# Patient Record
Sex: Female | Born: 1955 | Race: White | Hispanic: No | Marital: Married | Smoking: Former smoker
Health system: Southern US, Community
[De-identification: ages and names within clinical notes are randomized; demographics above are authoritative.]

## PROBLEM LIST (undated history)

## (undated) DIAGNOSIS — D3A092 Benign carcinoid tumor of the stomach: Secondary | ICD-10-CM

## (undated) DIAGNOSIS — M1990 Unspecified osteoarthritis, unspecified site: Secondary | ICD-10-CM

## (undated) DIAGNOSIS — E034 Atrophy of thyroid (acquired): Secondary | ICD-10-CM

## (undated) DIAGNOSIS — I10 Essential (primary) hypertension: Secondary | ICD-10-CM

## (undated) DIAGNOSIS — G5600 Carpal tunnel syndrome, unspecified upper limb: Secondary | ICD-10-CM

## (undated) DIAGNOSIS — K921 Melena: Secondary | ICD-10-CM

## (undated) DIAGNOSIS — F329 Major depressive disorder, single episode, unspecified: Secondary | ICD-10-CM

## (undated) DIAGNOSIS — K760 Fatty (change of) liver, not elsewhere classified: Secondary | ICD-10-CM

## (undated) DIAGNOSIS — G4730 Sleep apnea, unspecified: Secondary | ICD-10-CM

## (undated) DIAGNOSIS — E539 Vitamin B deficiency, unspecified: Secondary | ICD-10-CM

## (undated) DIAGNOSIS — E782 Mixed hyperlipidemia: Secondary | ICD-10-CM

## (undated) HISTORY — DX: Sleep apnea, unspecified: G47.30

## (undated) HISTORY — DX: Melena: K92.1

## (undated) HISTORY — DX: Atrophy of thyroid (acquired): E03.4

## (undated) HISTORY — DX: Benign carcinoid tumor of the stomach: D3A.092

## (undated) HISTORY — PX: REPLACEMENT TOTAL KNEE: SUR1224

## (undated) HISTORY — DX: Mixed hyperlipidemia: E78.2

## (undated) HISTORY — DX: Essential (primary) hypertension: I10

## (undated) HISTORY — DX: Vitamin B deficiency, unspecified: E53.9

## (undated) HISTORY — DX: Major depressive disorder, single episode, unspecified: F32.9

## (undated) HISTORY — DX: Fatty (change of) liver, not elsewhere classified: K76.0

## (undated) HISTORY — PX: OTHER SURGICAL HISTORY: SHX169

## (undated) HISTORY — DX: Carpal tunnel syndrome, unspecified upper limb: G56.00

## (undated) HISTORY — DX: Unspecified osteoarthritis, unspecified site: M19.90

---

## 2014-02-14 ENCOUNTER — Other Ambulatory Visit: Payer: Self-pay | Admitting: Orthopedic Surgery

## 2014-02-14 DIAGNOSIS — Z01818 Encounter for other preprocedural examination: Secondary | ICD-10-CM

## 2014-02-14 DIAGNOSIS — M170 Bilateral primary osteoarthritis of knee: Secondary | ICD-10-CM | POA: Insufficient documentation

## 2014-02-14 HISTORY — DX: Bilateral primary osteoarthritis of knee: M17.0

## 2014-02-25 ENCOUNTER — Other Ambulatory Visit: Payer: Self-pay

## 2014-03-11 ENCOUNTER — Ambulatory Visit
Admission: RE | Admit: 2014-03-11 | Discharge: 2014-03-11 | Disposition: A | Discharge status: Home / Self Care | Payer: BC Managed Care – PPO | Source: Ambulatory Visit | Attending: Orthopedic Surgery | Admitting: Orthopedic Surgery

## 2014-03-11 DIAGNOSIS — Z01818 Encounter for other preprocedural examination: Secondary | ICD-10-CM

## 2014-07-25 DIAGNOSIS — Z96652 Presence of left artificial knee joint: Secondary | ICD-10-CM | POA: Insufficient documentation

## 2014-07-25 HISTORY — DX: Presence of left artificial knee joint: Z96.652

## 2016-01-29 DIAGNOSIS — Z96651 Presence of right artificial knee joint: Secondary | ICD-10-CM

## 2016-01-29 HISTORY — DX: Presence of right artificial knee joint: Z96.651

## 2016-02-06 IMAGING — MR MR KNEE*L* W/O CM
4 series · 29 of 40 positions shown · non-contrast
Comparison: None.

CLINICAL DATA: Consider protocol for pre-surgical planning

EXAM:
MRI OF THE LEFT KNEE WITHOUT CONTRAST
TECHNIQUE: Multiplanar, multisequence MR imaging of the knee was performed. No
intravenous contrast was administered.

[Series 3: knee true sag · sagittal · 1.0mm · 0.39mm/px · 14 of 111 slices shown (1 of 2)]
[im 1/111]
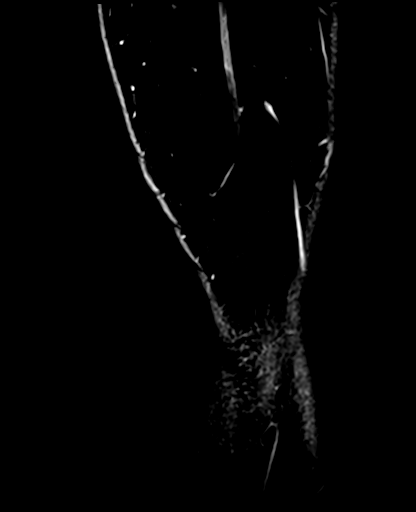
[im 5/111]
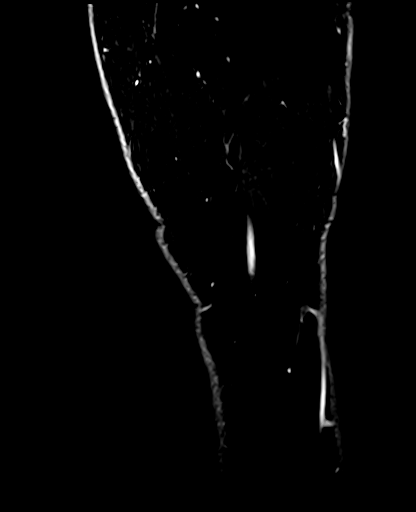
[im 10/111]
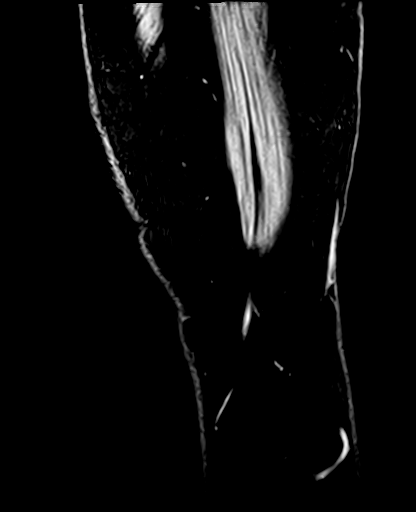
[im 14/111]
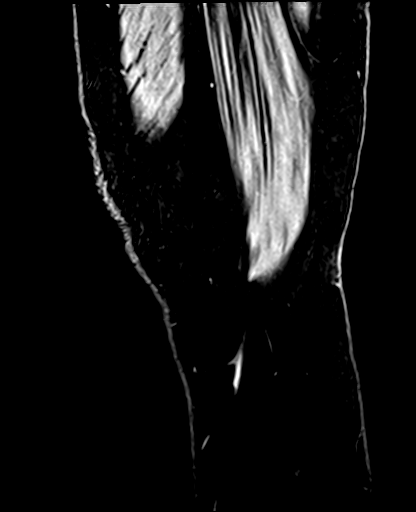
[im 19/111]
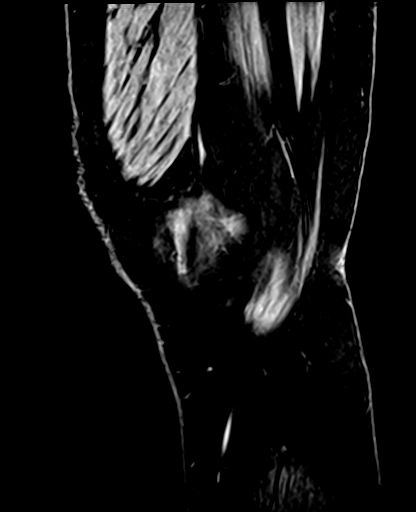
[im 23/111]
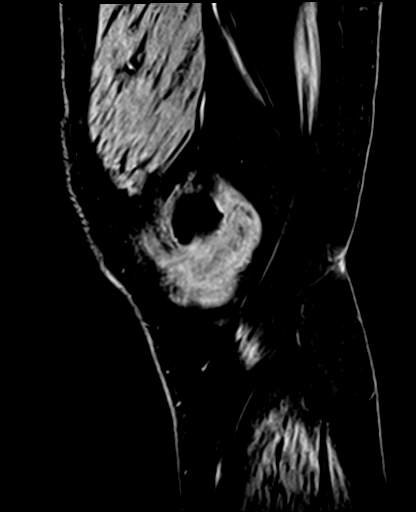
[im 28/111]
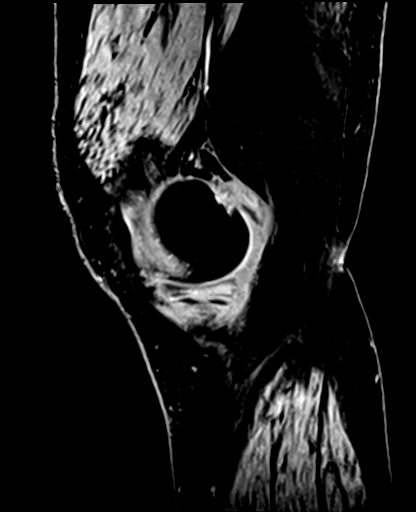
[im 33/111]
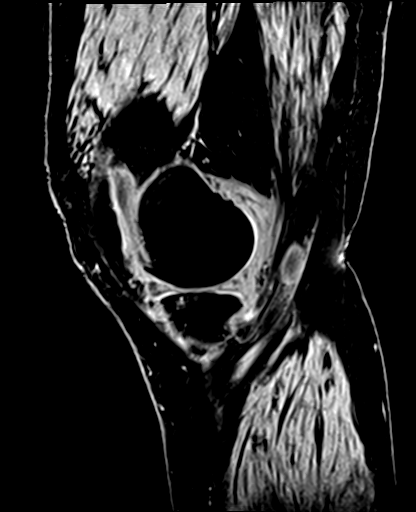
[im 46/111]
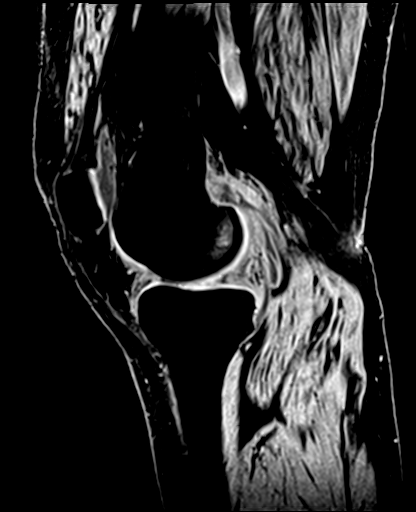
[im 56/111]
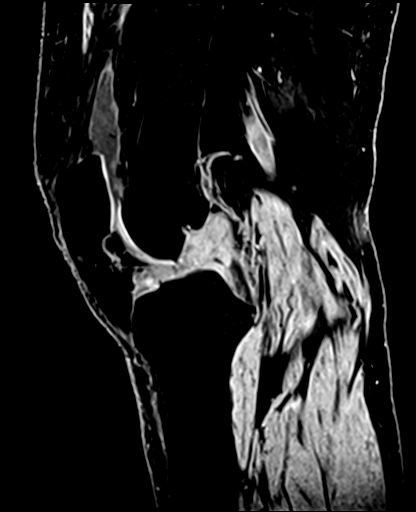
[im 65/111]
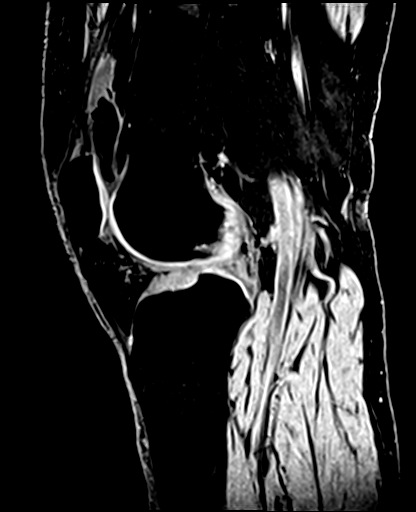
[im 78/111]
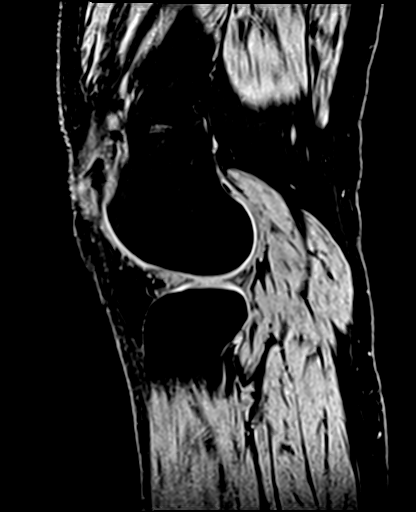
[im 92/111]
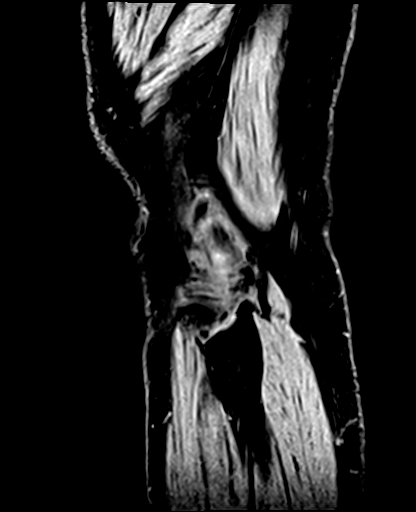
[im 106/111]
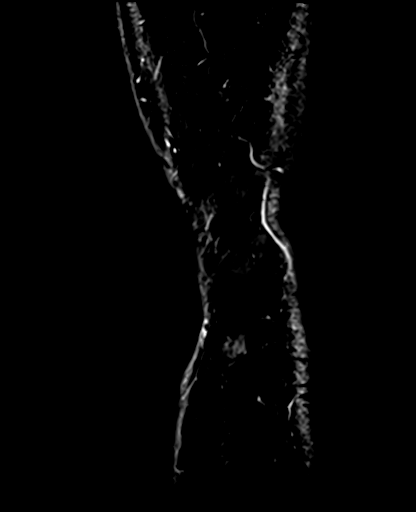

[Series 7: T1 · axial · 5.0mm · 1.02mm/px · z∈[-63,+70]mm · 4 of 20 slices shown (1 of 2)]
[im 1/20]
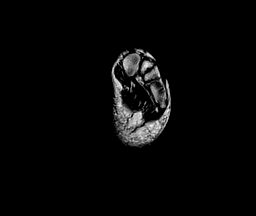
[im 7/20]
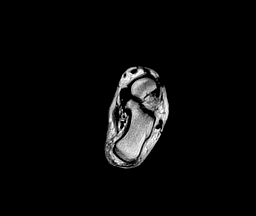
[im 13/20]
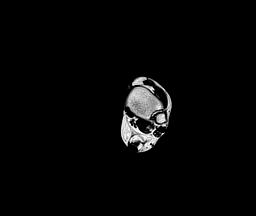
[im 20/20]
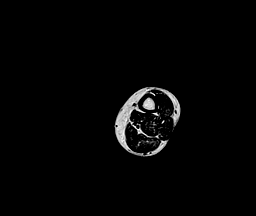

[Series 9: knee true sag · sagittal · 4.0mm · 0.51mm/px · 7 of 30 slices shown (2 of 2)]
[im 1/30]
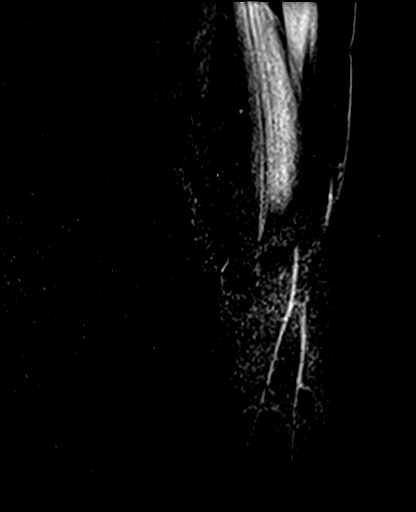
[im 5/30]
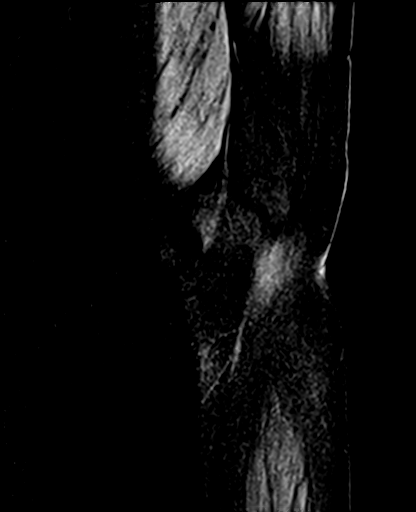
[im 10/30]
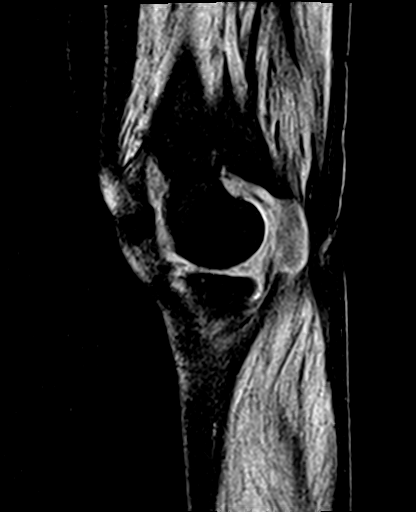
[im 15/30]
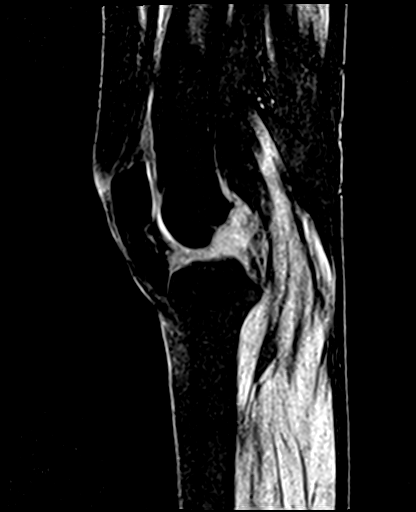
[im 20/30]
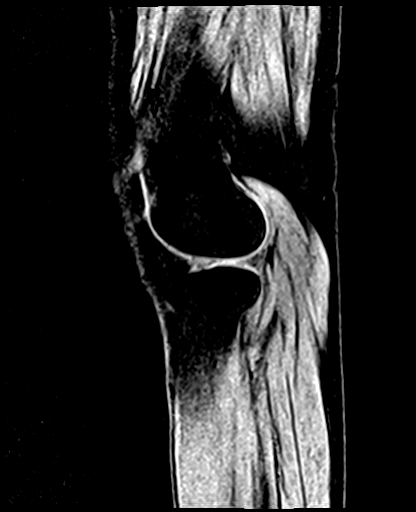
[im 25/30]
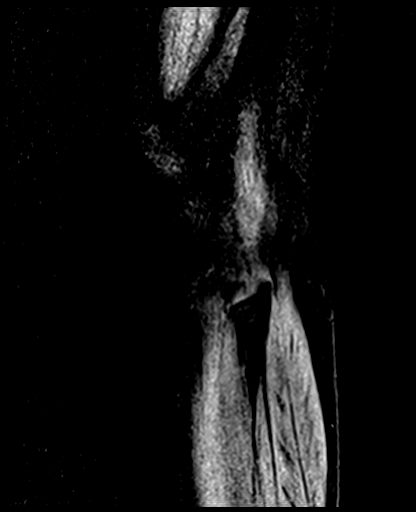
[im 30/30]
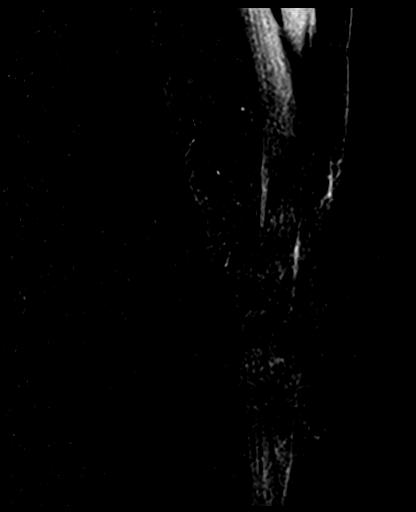

[Series 12: T1 · axial · 5.0mm · 1.41mm/px · z∈[+697,+830]mm · 4 of 20 slices shown (2 of 2)]
[im 1/20]
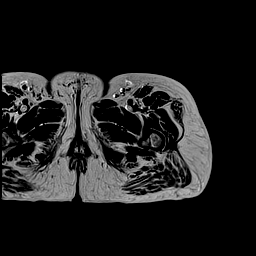
[im 7/20]
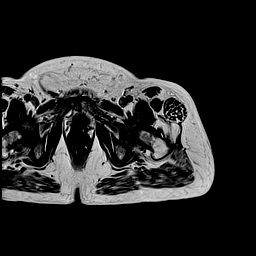
[im 13/20]
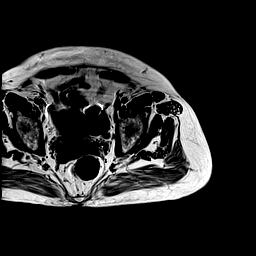
[im 20/20]
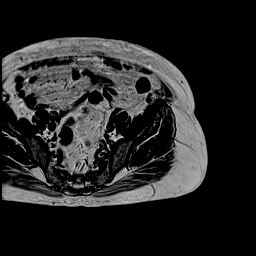

[29 of 40 positions shown; findings below may reference images not displayed]

FINDINGS: This examination is performed for preoperative planning prior to
left knee arthroplasty. The examination is not performed as a
diagnostic study of the left knee.

There is osteoarthritis of the medial femorotibial compartment with
subchondral reactive marrow changes and marginal osteophytosis.
There is medial femorotibial compartment cartilage loss. There is
expansion of the ACL with increased signal concerning for an ACL
cyst, but evaluation of the ACL is extremely limited on this
sequence. There is increased signal within the medial meniscus.

There is no focal marrow signal abnormality.

There is no hip fracture or dislocation. There is no avascular
necrosis.

There is no ankle fracture or dislocation. There is no malalignment.

The soft tissues are normal.
IMPRESSION: 1. MRI of the left knee performed for preoperative planning.
2. Medial left femorotibial compartment osteoarthritis.

## 2016-07-08 LAB — HM MAMMOGRAPHY

## 2016-07-12 DIAGNOSIS — D509 Iron deficiency anemia, unspecified: Secondary | ICD-10-CM | POA: Diagnosis not present

## 2016-07-12 DIAGNOSIS — D519 Vitamin B12 deficiency anemia, unspecified: Secondary | ICD-10-CM | POA: Diagnosis not present

## 2016-11-03 ENCOUNTER — Ambulatory Visit: Payer: BLUE CROSS/BLUE SHIELD | Admitting: Podiatry

## 2016-11-16 DIAGNOSIS — D649 Anemia, unspecified: Secondary | ICD-10-CM | POA: Diagnosis not present

## 2016-12-17 ENCOUNTER — Encounter: Payer: Self-pay | Admitting: Sports Medicine

## 2016-12-17 ENCOUNTER — Ambulatory Visit (INDEPENDENT_AMBULATORY_CARE_PROVIDER_SITE_OTHER): Payer: BLUE CROSS/BLUE SHIELD | Admitting: Sports Medicine

## 2016-12-17 VITALS — BP 123/74 | HR 78

## 2016-12-17 DIAGNOSIS — M79671 Pain in right foot: Secondary | ICD-10-CM

## 2016-12-17 DIAGNOSIS — M7751 Other enthesopathy of right foot: Secondary | ICD-10-CM | POA: Diagnosis not present

## 2016-12-17 DIAGNOSIS — M7661 Achilles tendinitis, right leg: Secondary | ICD-10-CM

## 2016-12-17 MED ORDER — MELOXICAM 15 MG PO TABS: 15.0000 mg | ORAL_TABLET | Freq: Every day | ORAL | 0 refills | Status: DC

## 2016-12-17 NOTE — Progress Notes (Signed)
Subjective: Karen Clay is a 61 y.o. female patient who presents to office for evaluation of Right heel pain. Patient complains of progressive pain especially over the last 2 months at the back of the heel. Patient has tried icing and anti-inflammatories with no relief. Patient denies any other pedal complaints.   Patient Active Problem List   Diagnosis Date Noted  . Status post total right knee replacement using cement 01/29/2016  . S/P left unicompartmental knee replacement 07/25/2014  . Localized osteoarthritis of knees, bilateral 02/14/2014    No current outpatient prescriptions on file prior to visit.   No current facility-administered medications on file prior to visit.     No Known Allergies  Objective:  General: Alert and oriented x3 in no acute distress  Dermatology: No open lesions bilateral lower extremities, no webspace macerations, no ecchymosis bilateral, all nails x 10 are well manicured.  Vascular: Dorsalis Pedis and Posterior Tibial pedal pulses 2/4, Capillary Fill Time 3 seconds, + pedal hair growth bilateral, no edema bilateral lower extremities, Temperature gradient within normal limits.  Neurology: Johney Maine sensation intact via light touch bilateral.   Musculoskeletal: Mild tenderness with palpation at insertion of the Achilles on Right,  there is calcaneal exostosis with mild soft tissue present and decreased ankle rom with knee extending  vs flexed resembling gastroc equnius , right greater than left, The achilles tendon feels intact with no nodularity or palpable dell, Thompson sign negative, Subtalar and midtarsal joint range of motion is within normal limits, there is no 1st ray hypermobility or acute  forefoot deformity noted bilateral.   Assessment and Plan: Problem List Items Addressed This Visit    None    Visit Diagnoses    Tendonitis, Achilles, right    -  Primary   Postcalcaneal bursitis of right foot       Pain of right heel          -Complete examination performed -Xrays were not able to be obtained this visit due to technical issues with x-ray machine will x-ray at next visit -Discussed treatement options for likely Achilles tendinitis -Dispense night splint -Rx Meloxicam and gentle stretching exercises -Patient to return to office 4 weeks or sooner if condition worsens.  Landis Martins, DPM

## 2016-12-17 NOTE — Patient Instructions (Signed)

## 2017-01-17 ENCOUNTER — Ambulatory Visit (INDEPENDENT_AMBULATORY_CARE_PROVIDER_SITE_OTHER): Payer: BLUE CROSS/BLUE SHIELD | Admitting: Podiatry

## 2017-01-17 ENCOUNTER — Ambulatory Visit (INDEPENDENT_AMBULATORY_CARE_PROVIDER_SITE_OTHER): Payer: BLUE CROSS/BLUE SHIELD

## 2017-01-17 ENCOUNTER — Encounter (INDEPENDENT_AMBULATORY_CARE_PROVIDER_SITE_OTHER): Payer: Self-pay

## 2017-01-17 DIAGNOSIS — M7661 Achilles tendinitis, right leg: Secondary | ICD-10-CM

## 2017-01-17 MED ORDER — MELOXICAM 15 MG PO TABS: 15.0000 mg | ORAL_TABLET | Freq: Every day | ORAL | 0 refills | Status: DC

## 2017-01-17 NOTE — Progress Notes (Signed)
   Subjective:    Patient ID: Karen Clay, female    DOB: Jun 14, 1955, 61 y.o.   MRN: [id number]  HPI 61 y.o. female presents for f/u of R Achilles Tendonitis. States her pain is somewhat better. States she notices a difference when she uses her night splint but admits to not using it all the time. Meloxicam helps, has been taking it on an as needed basis.  Review of Systems     Objective:   Physical Exam There were no vitals filed for this visit. General AA&O x3. Normal mood and affect.  Vascular Dorsalis pedis and posterior tibial pulses  present 2+ bilaterally  Capillary refill normal to all digits. Pedal hair growth normal.  Neurologic Epicritic sensation grossly present.  Dermatologic No open lesions. Interspaces clear of maceration. Nails well groomed and normal in appearance.  Orthopedic: POP R Achilles tendon insertion. Decreased ROM R ankle with knee flexed and extended.   Radiographs: Taken and reviewed. No acute fractures. No evidence of calcaneal stress fracture. Small posterior spur noted.     Assessment & Plan:  Achilles Tendonitis, right; improving -XR reviewed as above.  -Discussed importance of stretching and night splint for decreasing pain and inflammation. -eRx for Meloxicam -Dicussed if symptoms do not continue to improve, will consider MRI.   Return if symptoms worsen or fail to improve.

## 2017-05-19 DIAGNOSIS — D509 Iron deficiency anemia, unspecified: Secondary | ICD-10-CM | POA: Diagnosis not present

## 2017-06-23 LAB — HM COLONOSCOPY

## 2017-07-26 LAB — HM PAP SMEAR: HM Pap smear: NEGATIVE

## 2017-07-29 DIAGNOSIS — C7A8 Other malignant neuroendocrine tumors: Secondary | ICD-10-CM | POA: Diagnosis not present

## 2017-07-29 DIAGNOSIS — D649 Anemia, unspecified: Secondary | ICD-10-CM | POA: Diagnosis not present

## 2018-02-07 DIAGNOSIS — D508 Other iron deficiency anemias: Secondary | ICD-10-CM

## 2018-02-22 ENCOUNTER — Ambulatory Visit: Payer: BLUE CROSS/BLUE SHIELD | Admitting: Podiatry

## 2018-02-22 ENCOUNTER — Other Ambulatory Visit: Payer: Self-pay | Admitting: Podiatry

## 2018-02-22 ENCOUNTER — Encounter: Payer: Self-pay | Admitting: Podiatry

## 2018-02-22 ENCOUNTER — Ambulatory Visit (INDEPENDENT_AMBULATORY_CARE_PROVIDER_SITE_OTHER): Payer: BLUE CROSS/BLUE SHIELD

## 2018-02-22 DIAGNOSIS — M7661 Achilles tendinitis, right leg: Secondary | ICD-10-CM

## 2018-02-22 DIAGNOSIS — M79671 Pain in right foot: Secondary | ICD-10-CM

## 2018-02-22 MED ORDER — TRIAMCINOLONE ACETONIDE 10 MG/ML IJ SUSP
10.0000 mg | Freq: Once | INTRAMUSCULAR | Status: AC
  Administered 2018-02-22: 10 mg

## 2018-02-22 NOTE — Progress Notes (Signed)
Subjective:   Patient ID: Karen Clay, female   DOB: 62 y.o.   MRN: [id number]   HPI Patient states that she is had chronic pain in the back of the right heel and states that the splint that she uses is not really been effective and is gradually become more of an issue for her   ROS      Objective:  Physical Exam  Neurovascular status intact with inflammation of the posterior heel the Achilles tendon medial side with inflammation noted with no central lateral tendon involvement.  Patient has no equinus condition noted and is found to have good digital perfusion and is well oriented x3     Assessment:  Achilles tendinitis acute nature right medial side     Plan:  H&P x-ray reviewed and discussed a more aggressive approach to try to eradicate the problem.  I went ahead and carefully injected the right medial side 3 mg dexamethasone Kenalog 5 Milgram Xylocaine after first discussing her chances for rupture associated with injection.  I then applied air fracture walker to completely immobilize and advised on ice therapy and discussed physical therapy or shockwave if symptoms persist  X-rays indicate that there is mild small spur formation posterior right heel with no indication stress fracture arthritis

## 2018-02-22 NOTE — Patient Instructions (Signed)

## 2018-03-15 ENCOUNTER — Ambulatory Visit: Payer: BLUE CROSS/BLUE SHIELD | Admitting: Podiatry

## 2018-03-17 ENCOUNTER — Encounter: Payer: Self-pay | Admitting: Podiatry

## 2018-03-17 ENCOUNTER — Ambulatory Visit: Payer: BLUE CROSS/BLUE SHIELD | Admitting: Podiatry

## 2018-03-17 DIAGNOSIS — M7661 Achilles tendinitis, right leg: Secondary | ICD-10-CM | POA: Diagnosis not present

## 2018-03-20 NOTE — Progress Notes (Signed)
Subjective:   Patient ID: Karen Clay, female   DOB: 62 y.o.   MRN: [id number]   HPI Patient presents stating that she seems to be improved with pain that still present but not as intense as previous   ROS      Objective:  Physical Exam  Neurovascular status was found to be intact with posterior pain that is improved in the Achilles with pain still noted upon deep palpation but improved with mild equinus condition     Assessment:  Improved Achilles tendinitis right with inflammation still present but not to the degree as previous     Plan:  I advised this patient on anti-inflammatories physical therapy support and stretching exercises.  Patient will be seen back for [place] to recheck again in approximately 4 to 6 weeks or earlier if needed and still may require shockwave therapy

## 2018-04-10 DIAGNOSIS — Z79899 Other long term (current) drug therapy: Secondary | ICD-10-CM

## 2018-04-10 DIAGNOSIS — D509 Iron deficiency anemia, unspecified: Secondary | ICD-10-CM | POA: Diagnosis not present

## 2018-05-13 DIAGNOSIS — J324 Chronic pansinusitis: Secondary | ICD-10-CM | POA: Diagnosis not present

## 2018-06-10 DIAGNOSIS — G4733 Obstructive sleep apnea (adult) (pediatric): Secondary | ICD-10-CM | POA: Diagnosis not present

## 2018-06-26 DIAGNOSIS — D3A092 Benign carcinoid tumor of the stomach: Secondary | ICD-10-CM | POA: Diagnosis not present

## 2018-06-26 DIAGNOSIS — D509 Iron deficiency anemia, unspecified: Secondary | ICD-10-CM | POA: Diagnosis not present

## 2018-06-30 DIAGNOSIS — R7301 Impaired fasting glucose: Secondary | ICD-10-CM | POA: Diagnosis not present

## 2018-06-30 DIAGNOSIS — F330 Major depressive disorder, recurrent, mild: Secondary | ICD-10-CM | POA: Diagnosis not present

## 2018-06-30 DIAGNOSIS — G4733 Obstructive sleep apnea (adult) (pediatric): Secondary | ICD-10-CM | POA: Diagnosis not present

## 2018-06-30 DIAGNOSIS — E782 Mixed hyperlipidemia: Secondary | ICD-10-CM | POA: Diagnosis not present

## 2018-06-30 DIAGNOSIS — Z6828 Body mass index (BMI) 28.0-28.9, adult: Secondary | ICD-10-CM | POA: Diagnosis not present

## 2018-06-30 DIAGNOSIS — Z85020 Personal history of malignant carcinoid tumor of stomach: Secondary | ICD-10-CM | POA: Diagnosis not present

## 2018-06-30 DIAGNOSIS — E034 Atrophy of thyroid (acquired): Secondary | ICD-10-CM | POA: Diagnosis not present

## 2018-06-30 DIAGNOSIS — I10 Essential (primary) hypertension: Secondary | ICD-10-CM | POA: Diagnosis not present

## 2018-06-30 DIAGNOSIS — E663 Overweight: Secondary | ICD-10-CM | POA: Diagnosis not present

## 2018-06-30 DIAGNOSIS — D508 Other iron deficiency anemias: Secondary | ICD-10-CM | POA: Diagnosis not present

## 2018-07-06 DIAGNOSIS — R918 Other nonspecific abnormal finding of lung field: Secondary | ICD-10-CM | POA: Diagnosis not present

## 2018-07-06 DIAGNOSIS — I700 Atherosclerosis of aorta: Secondary | ICD-10-CM | POA: Diagnosis not present

## 2018-07-06 DIAGNOSIS — C7A092 Malignant carcinoid tumor of the stomach: Secondary | ICD-10-CM | POA: Diagnosis not present

## 2018-07-06 DIAGNOSIS — E7889 Other lipoprotein metabolism disorders: Secondary | ICD-10-CM | POA: Diagnosis not present

## 2018-07-06 DIAGNOSIS — K8080 Other cholelithiasis without obstruction: Secondary | ICD-10-CM | POA: Diagnosis not present

## 2018-07-06 DIAGNOSIS — D3A092 Benign carcinoid tumor of the stomach: Secondary | ICD-10-CM | POA: Diagnosis not present

## 2018-07-09 DIAGNOSIS — G4733 Obstructive sleep apnea (adult) (pediatric): Secondary | ICD-10-CM | POA: Diagnosis not present

## 2018-07-18 DIAGNOSIS — I10 Essential (primary) hypertension: Secondary | ICD-10-CM | POA: Diagnosis not present

## 2018-07-18 DIAGNOSIS — C7A092 Malignant carcinoid tumor of the stomach: Secondary | ICD-10-CM | POA: Diagnosis not present

## 2018-07-18 DIAGNOSIS — K317 Polyp of stomach and duodenum: Secondary | ICD-10-CM | POA: Diagnosis not present

## 2018-07-18 DIAGNOSIS — K760 Fatty (change of) liver, not elsewhere classified: Secondary | ICD-10-CM | POA: Diagnosis not present

## 2018-07-18 DIAGNOSIS — E039 Hypothyroidism, unspecified: Secondary | ICD-10-CM | POA: Diagnosis not present

## 2018-07-18 DIAGNOSIS — Z800 Family history of malignant neoplasm of digestive organs: Secondary | ICD-10-CM | POA: Diagnosis not present

## 2018-07-18 DIAGNOSIS — E538 Deficiency of other specified B group vitamins: Secondary | ICD-10-CM | POA: Diagnosis not present

## 2018-07-18 DIAGNOSIS — K31811 Angiodysplasia of stomach and duodenum with bleeding: Secondary | ICD-10-CM | POA: Diagnosis not present

## 2018-07-18 DIAGNOSIS — D509 Iron deficiency anemia, unspecified: Secondary | ICD-10-CM | POA: Diagnosis not present

## 2018-07-18 DIAGNOSIS — D131 Benign neoplasm of stomach: Secondary | ICD-10-CM | POA: Diagnosis not present

## 2018-07-18 DIAGNOSIS — K219 Gastro-esophageal reflux disease without esophagitis: Secondary | ICD-10-CM | POA: Diagnosis not present

## 2018-07-18 DIAGNOSIS — F329 Major depressive disorder, single episode, unspecified: Secondary | ICD-10-CM | POA: Diagnosis not present

## 2018-07-18 DIAGNOSIS — D3A092 Benign carcinoid tumor of the stomach: Secondary | ICD-10-CM | POA: Diagnosis not present

## 2018-07-18 DIAGNOSIS — K2950 Unspecified chronic gastritis without bleeding: Secondary | ICD-10-CM | POA: Diagnosis not present

## 2018-07-18 DIAGNOSIS — Z79899 Other long term (current) drug therapy: Secondary | ICD-10-CM | POA: Diagnosis not present

## 2018-07-26 DIAGNOSIS — Z01419 Encounter for gynecological examination (general) (routine) without abnormal findings: Secondary | ICD-10-CM | POA: Diagnosis not present

## 2018-07-26 DIAGNOSIS — Z1231 Encounter for screening mammogram for malignant neoplasm of breast: Secondary | ICD-10-CM | POA: Diagnosis not present

## 2018-08-09 DIAGNOSIS — Z0000 Encounter for general adult medical examination without abnormal findings: Secondary | ICD-10-CM | POA: Diagnosis not present

## 2018-08-09 DIAGNOSIS — M13841 Other specified arthritis, right hand: Secondary | ICD-10-CM | POA: Diagnosis not present

## 2018-08-09 DIAGNOSIS — Z6827 Body mass index (BMI) 27.0-27.9, adult: Secondary | ICD-10-CM | POA: Diagnosis not present

## 2018-08-09 DIAGNOSIS — G4733 Obstructive sleep apnea (adult) (pediatric): Secondary | ICD-10-CM | POA: Diagnosis not present

## 2018-08-09 DIAGNOSIS — M25531 Pain in right wrist: Secondary | ICD-10-CM | POA: Diagnosis not present

## 2018-08-09 DIAGNOSIS — N6489 Other specified disorders of breast: Secondary | ICD-10-CM | POA: Diagnosis not present

## 2018-08-09 DIAGNOSIS — R928 Other abnormal and inconclusive findings on diagnostic imaging of breast: Secondary | ICD-10-CM | POA: Diagnosis not present

## 2018-09-08 DIAGNOSIS — G4733 Obstructive sleep apnea (adult) (pediatric): Secondary | ICD-10-CM | POA: Diagnosis not present

## 2018-10-03 DIAGNOSIS — E034 Atrophy of thyroid (acquired): Secondary | ICD-10-CM | POA: Diagnosis not present

## 2018-10-03 DIAGNOSIS — D508 Other iron deficiency anemias: Secondary | ICD-10-CM | POA: Diagnosis not present

## 2018-10-03 DIAGNOSIS — Z85020 Personal history of malignant carcinoid tumor of stomach: Secondary | ICD-10-CM | POA: Diagnosis not present

## 2018-10-03 DIAGNOSIS — E663 Overweight: Secondary | ICD-10-CM | POA: Diagnosis not present

## 2018-10-03 DIAGNOSIS — G4733 Obstructive sleep apnea (adult) (pediatric): Secondary | ICD-10-CM | POA: Diagnosis not present

## 2018-10-03 DIAGNOSIS — I10 Essential (primary) hypertension: Secondary | ICD-10-CM | POA: Diagnosis not present

## 2018-10-03 DIAGNOSIS — Z6828 Body mass index (BMI) 28.0-28.9, adult: Secondary | ICD-10-CM | POA: Diagnosis not present

## 2018-10-03 DIAGNOSIS — F330 Major depressive disorder, recurrent, mild: Secondary | ICD-10-CM | POA: Diagnosis not present

## 2018-10-03 DIAGNOSIS — L309 Dermatitis, unspecified: Secondary | ICD-10-CM | POA: Diagnosis not present

## 2018-10-03 DIAGNOSIS — E782 Mixed hyperlipidemia: Secondary | ICD-10-CM | POA: Diagnosis not present

## 2018-10-09 DIAGNOSIS — G4733 Obstructive sleep apnea (adult) (pediatric): Secondary | ICD-10-CM | POA: Diagnosis not present

## 2018-10-17 DIAGNOSIS — D509 Iron deficiency anemia, unspecified: Secondary | ICD-10-CM | POA: Diagnosis not present

## 2018-10-25 DIAGNOSIS — N943 Premenstrual tension syndrome: Secondary | ICD-10-CM | POA: Diagnosis not present

## 2018-10-25 DIAGNOSIS — F411 Generalized anxiety disorder: Secondary | ICD-10-CM | POA: Diagnosis not present

## 2018-10-25 DIAGNOSIS — F331 Major depressive disorder, recurrent, moderate: Secondary | ICD-10-CM | POA: Diagnosis not present

## 2018-11-08 DIAGNOSIS — G4733 Obstructive sleep apnea (adult) (pediatric): Secondary | ICD-10-CM | POA: Diagnosis not present

## 2018-12-09 DIAGNOSIS — G4733 Obstructive sleep apnea (adult) (pediatric): Secondary | ICD-10-CM | POA: Diagnosis not present

## 2019-01-08 DIAGNOSIS — E782 Mixed hyperlipidemia: Secondary | ICD-10-CM | POA: Diagnosis not present

## 2019-01-08 DIAGNOSIS — Z23 Encounter for immunization: Secondary | ICD-10-CM | POA: Diagnosis not present

## 2019-01-08 DIAGNOSIS — G4733 Obstructive sleep apnea (adult) (pediatric): Secondary | ICD-10-CM | POA: Diagnosis not present

## 2019-01-08 DIAGNOSIS — E034 Atrophy of thyroid (acquired): Secondary | ICD-10-CM | POA: Diagnosis not present

## 2019-01-08 DIAGNOSIS — I10 Essential (primary) hypertension: Secondary | ICD-10-CM | POA: Diagnosis not present

## 2019-01-08 DIAGNOSIS — E663 Overweight: Secondary | ICD-10-CM | POA: Diagnosis not present

## 2019-01-08 DIAGNOSIS — Z6828 Body mass index (BMI) 28.0-28.9, adult: Secondary | ICD-10-CM | POA: Diagnosis not present

## 2019-01-08 DIAGNOSIS — F330 Major depressive disorder, recurrent, mild: Secondary | ICD-10-CM | POA: Diagnosis not present

## 2019-01-09 DIAGNOSIS — G4733 Obstructive sleep apnea (adult) (pediatric): Secondary | ICD-10-CM | POA: Diagnosis not present

## 2019-01-30 DIAGNOSIS — G4733 Obstructive sleep apnea (adult) (pediatric): Secondary | ICD-10-CM | POA: Diagnosis not present

## 2019-02-08 DIAGNOSIS — G4733 Obstructive sleep apnea (adult) (pediatric): Secondary | ICD-10-CM | POA: Diagnosis not present

## 2019-03-11 DIAGNOSIS — G4733 Obstructive sleep apnea (adult) (pediatric): Secondary | ICD-10-CM | POA: Diagnosis not present

## 2019-04-10 DIAGNOSIS — G4733 Obstructive sleep apnea (adult) (pediatric): Secondary | ICD-10-CM | POA: Diagnosis not present

## 2019-04-18 DIAGNOSIS — D509 Iron deficiency anemia, unspecified: Secondary | ICD-10-CM | POA: Diagnosis not present

## 2019-05-05 DIAGNOSIS — J00 Acute nasopharyngitis [common cold]: Secondary | ICD-10-CM | POA: Diagnosis not present

## 2019-05-05 DIAGNOSIS — Z20828 Contact with and (suspected) exposure to other viral communicable diseases: Secondary | ICD-10-CM | POA: Diagnosis not present

## 2019-05-11 DIAGNOSIS — G4733 Obstructive sleep apnea (adult) (pediatric): Secondary | ICD-10-CM | POA: Diagnosis not present

## 2019-06-11 DIAGNOSIS — G4733 Obstructive sleep apnea (adult) (pediatric): Secondary | ICD-10-CM | POA: Diagnosis not present

## 2019-06-19 ENCOUNTER — Other Ambulatory Visit: Payer: Self-pay | Admitting: Family Medicine

## 2019-06-28 ENCOUNTER — Other Ambulatory Visit: Payer: Self-pay | Admitting: Family Medicine

## 2019-07-09 ENCOUNTER — Other Ambulatory Visit: Payer: Self-pay

## 2019-07-09 ENCOUNTER — Encounter: Payer: Self-pay | Admitting: Family Medicine

## 2019-07-09 ENCOUNTER — Ambulatory Visit (INDEPENDENT_AMBULATORY_CARE_PROVIDER_SITE_OTHER): Payer: PPO | Admitting: Family Medicine

## 2019-07-09 VITALS — BP 126/70 | HR 87 | Temp 97.6°F | Ht 63.0 in | Wt 176.0 lb

## 2019-07-09 DIAGNOSIS — E1169 Type 2 diabetes mellitus with other specified complication: Secondary | ICD-10-CM | POA: Diagnosis not present

## 2019-07-09 DIAGNOSIS — M545 Low back pain, unspecified: Secondary | ICD-10-CM

## 2019-07-09 DIAGNOSIS — E782 Mixed hyperlipidemia: Secondary | ICD-10-CM

## 2019-07-09 DIAGNOSIS — E785 Hyperlipidemia, unspecified: Secondary | ICD-10-CM

## 2019-07-09 DIAGNOSIS — Z6831 Body mass index (BMI) 31.0-31.9, adult: Secondary | ICD-10-CM | POA: Diagnosis not present

## 2019-07-09 DIAGNOSIS — I10 Essential (primary) hypertension: Secondary | ICD-10-CM | POA: Diagnosis not present

## 2019-07-09 DIAGNOSIS — M5136 Other intervertebral disc degeneration, lumbar region: Secondary | ICD-10-CM | POA: Diagnosis not present

## 2019-07-09 HISTORY — DX: Low back pain, unspecified: M54.50

## 2019-07-09 MED ORDER — TIZANIDINE HCL 2 MG PO TABS: 2.0000 mg | ORAL_TABLET | Freq: Three times a day (TID) | ORAL | 0 refills | Status: DC

## 2019-07-09 NOTE — Assessment & Plan Note (Signed)
Well controlled.  ?No changes to medicines.  ?Continue to work on eating a healthy diet and exercise.  ?Labs drawn today.  ?

## 2019-07-09 NOTE — Assessment & Plan Note (Signed)
Get lumbar xray Recommend stretching and yoga Recommend ice.  Lumbar back - tizanidine up to three times a day for muscle spasm. May use tylenol or ibuprofen also.

## 2019-07-09 NOTE — Progress Notes (Signed)
Subjective:  Patient ID: Karen Clay, female    DOB: [birth year]  Age: 64 y.o. MRN: [id number]  Chief Complaint  Patient presents with  . Hypertension  . Hyperlipidemia  . Depression    HPI Patient is a 64 year old white female who presents for follow-up of hypertension, hyperlipidemia, depression, and diabetes.  Her diabetes has been in excellent control her last A1c was 4.4.  She does not check her sugars.  She denies any polyphagia, polyuria,, or polydipsia.  She has been following the keto diet and has done very well with weight loss.  Minimal exercise due to her recent low back pain she has been experiencing.  She takes her medications regularly and tolerates them well.  She follows up every 6 months. Her depression is well controlled on Wellbutrin and Prozac and has been for several years.  She does not wish to discontinue these as they have worked very well for her.   Social History   Socioeconomic History  . Marital status: Married    Spouse name: Not on file  . Number of children: 3  . Years of education: Not on file  . Highest education level: Not on file  Occupational History  . Occupation: self employed  Tobacco Use  . Smoking status: Former Research scientist (life sciences)  . Smokeless tobacco: Never Used  Substance and Sexual Activity  . Alcohol use: Yes    Alcohol/week: 14.0 standard drinks    Types: 14 Glasses of wine per week    Comment: averages 2 glasses a night  . Drug use: No  . Sexual activity: Not on file  Other Topics Concern  . Not on file  Social History Narrative  . Not on file   Social Determinants of Health   Financial Resource Strain:   . Difficulty of Paying Living Expenses: Not on file  Food Insecurity:   . Worried About Charity fundraiser in the Last Year: Not on file  . Ran Out of Food in the Last Year: Not on file  Transportation Needs:   . Lack of Transportation (Medical): Not on file  . Lack of Transportation (Non-Medical): Not on file    Physical Activity:   . Days of Exercise per Week: Not on file  . Minutes of Exercise per Session: Not on file  Stress:   . Feeling of Stress : Not on file  Social Connections:   . Frequency of Communication with Friends and Family: Not on file  . Frequency of Social Gatherings with Friends and Family: Not on file  . Attends Religious Services: Not on file  . Active Member of Clubs or Organizations: Not on file  . Attends Archivist Meetings: Not on file  . Marital Status: Not on file   Past Medical History:  Diagnosis Date  . Atrophy of thyroid   . Benign carcinoid tumor of the stomach   . CTS (carpal tunnel syndrome)   . Essential hypertension   . Major depressive disorder   . Melena   . Mixed hyperlipidemia   . Nonalcoholic fatty liver disease   . Osteoarthritis   . Sleep apnea   . Vitamin B deficiency    Family History  Problem Relation Age of Onset  . Aneurysm Mother   . Heart failure Father   . CAD Father     Review of Systems  Constitutional: Negative for chills, fatigue and fever.  HENT: Negative for congestion, ear pain and sore throat.   Respiratory: Negative  for cough and shortness of breath.   Cardiovascular: Negative for chest pain.  Gastrointestinal: Negative for abdominal pain, constipation, diarrhea, nausea and vomiting.  Endocrine: Negative for polydipsia, polyphagia and polyuria.  Genitourinary: Negative for dysuria and urgency.  Musculoskeletal: Positive for back pain. Negative for arthralgias and myalgias.       Bilateral in lumbar and buttocks. Stretching helps. Tried advil sporadically. Helps a little.  Unable to tell if it is worse with ambulation or prolonged sitting. Occasionally pain shoots down legs. Not specific to one in particularly.  Neurological: Negative for dizziness and headaches.  Psychiatric/Behavioral: Negative for dysphoric mood. The patient is not nervous/anxious.      Objective:  BP 126/70 (BP Location: Left Arm,  Patient Position: Sitting)   Pulse 87   Temp 97.6 F (36.4 C) (Temporal)   Ht 5\' 3"  (1.6 m)   Wt 176 lb (79.8 kg)   SpO2 98%   BMI 31.18 kg/m   BP/Weight 07/09/2019 12/17/2016 0000000  Systolic BP [id number] -  Diastolic BP 70 74 -  Wt. (Lbs) 176 - 165  BMI 31.18 - -    Physical Exam Constitutional:      Appearance: Normal appearance.  Neck:     Vascular: No carotid bruit.  Cardiovascular:     Rate and Rhythm: Normal rate and regular rhythm.     Pulses: Normal pulses.  Pulmonary:     Effort: Pulmonary effort is normal.     Breath sounds: Normal breath sounds.  Abdominal:     General: Abdomen is flat. Bowel sounds are normal.     Palpations: Abdomen is soft.     Tenderness: There is no abdominal tenderness.  Musculoskeletal:        General: No swelling or tenderness.     Comments: Lumbar back is nontender. BL Buttocks nontender.   Neurological:     Mental Status: She is alert.  Psychiatric:        Mood and Affect: Mood normal.        Behavior: Behavior normal.     No results found for: WBC, HGB, HCT, PLT, GLUCOSE, CHOL, TRIG, HDL, LDLDIRECT, LDLCALC, ALT, AST, NA, K, CL, CREATININE, BUN, CO2, TSH, PSA, INR, GLUF, HGBA1C, MICROALBUR    Assessment & Plan:  Essential hypertension, benign Well controlled.  No changes to medicines.  Continue to work on eating a healthy diet and exercise.  Labs drawn today.   Dyslipidemia associated with type 2 diabetes mellitus ([place]) Well controlled.  No changes to medicines.  Continue to work on eating a healthy diet and exercise.  Labs drawn today.   Lumbar pain Get lumbar xray Recommend stretching and yoga Recommend ice.  Lumbar back - tizanidine up to three times a day for muscle spasm. May use tylenol or ibuprofen also.  Mixed hyperlipidemia Well controlled.  No changes to medicines.  Continue to work on eating a healthy diet and exercise.  Labs drawn today.      Follow-up: Return in about 6 months (around  01/09/2020) for fasting.  AVS was given to patient prior to departure.  Rochel Brome [facility] [phone]

## 2019-07-09 NOTE — Patient Instructions (Addendum)
Continue to eat healthy.  Recommend stretching and yoga Recommend ice.  Lumbar back - tizanidine up to three times a day for muscle spasm. May use tylenol or ibuprofen also.

## 2019-07-10 LAB — COMP. METABOLIC PANEL (12)
AST: 31 IU/L (ref 0–40)
Albumin/Globulin Ratio: 1.8 (ref 1.2–2.2)
Albumin: 4.9 g/dL — ABNORMAL HIGH (ref 3.8–4.8)
Alkaline Phosphatase: 80 IU/L (ref 39–117)
BUN/Creatinine Ratio: 36 — ABNORMAL HIGH (ref 12–28)
BUN: 23 mg/dL (ref 8–27)
Bilirubin Total: 0.3 mg/dL (ref 0.0–1.2)
Calcium: 10.2 mg/dL (ref 8.7–10.3)
Chloride: 101 mmol/L (ref 96–106)
Creatinine, Ser: 0.64 mg/dL (ref 0.57–1.00)
GFR calc Af Amer: 108 mL/min/{1.73_m2} (ref >59)
GFR calc non Af Amer: 93 mL/min/{1.73_m2} (ref >59)
Globulin, Total: 2.7 g/dL (ref 1.5–4.5)
Glucose: 101 mg/dL — ABNORMAL HIGH (ref 65–99)
Potassium: 5.1 mmol/L (ref 3.5–5.2)
Sodium: 140 mmol/L (ref 134–144)
Total Protein: 7.6 g/dL (ref 6.0–8.5)

## 2019-07-10 LAB — LIPID PANEL
Chol/HDL Ratio: 2.8 ratio (ref 0.0–4.4)
Cholesterol, Total: 169 mg/dL (ref 100–199)
HDL: 60 mg/dL (ref >39)
LDL Chol Calc (NIH): 85 mg/dL (ref 0–99)
Triglycerides: 140 mg/dL (ref 0–149)
VLDL Cholesterol Cal: 24 mg/dL (ref 5–40)

## 2019-07-10 LAB — CBC WITH DIFFERENTIAL/PLATELET
Basophils Absolute: 0.1 10*3/uL (ref 0.0–0.2)
Basos: 1 %
EOS (ABSOLUTE): 0.5 10*3/uL — ABNORMAL HIGH (ref 0.0–0.4)
Eos: 7 %
Hematocrit: 40.1 % (ref 34.0–46.6)
Hemoglobin: 13.4 g/dL (ref 11.1–15.9)
Immature Grans (Abs): 0.1 10*3/uL (ref 0.0–0.1)
Immature Granulocytes: 1 %
Lymphocytes Absolute: 0.9 10*3/uL (ref 0.7–3.1)
Lymphs: 14 %
MCH: 33.1 pg — ABNORMAL HIGH (ref 26.6–33.0)
MCHC: 33.4 g/dL (ref 31.5–35.7)
MCV: 99 fL — ABNORMAL HIGH (ref 79–97)
Monocytes Absolute: 0.7 10*3/uL (ref 0.1–0.9)
Monocytes: 10 %
Neutrophils Absolute: 4.4 10*3/uL (ref 1.4–7.0)
Neutrophils: 67 %
Platelets: 206 10*3/uL (ref 150–450)
RBC: 4.05 x10E6/uL (ref 3.77–5.28)
RDW: 11.8 % (ref 11.7–15.4)
WBC: 6.6 10*3/uL (ref 3.4–10.8)

## 2019-07-10 LAB — CARDIOVASCULAR RISK ASSESSMENT

## 2019-07-16 ENCOUNTER — Other Ambulatory Visit: Payer: Self-pay

## 2019-07-16 DIAGNOSIS — M545 Low back pain, unspecified: Secondary | ICD-10-CM

## 2019-07-18 DIAGNOSIS — D3A092 Benign carcinoid tumor of the stomach: Secondary | ICD-10-CM | POA: Diagnosis not present

## 2019-07-18 DIAGNOSIS — K648 Other hemorrhoids: Secondary | ICD-10-CM | POA: Diagnosis not present

## 2019-07-31 DIAGNOSIS — Z1231 Encounter for screening mammogram for malignant neoplasm of breast: Secondary | ICD-10-CM | POA: Diagnosis not present

## 2019-08-21 ENCOUNTER — Other Ambulatory Visit: Payer: Self-pay | Admitting: Family Medicine

## 2019-08-30 DIAGNOSIS — G4733 Obstructive sleep apnea (adult) (pediatric): Secondary | ICD-10-CM | POA: Diagnosis not present

## 2019-09-05 DIAGNOSIS — L308 Other specified dermatitis: Secondary | ICD-10-CM | POA: Diagnosis not present

## 2019-09-06 ENCOUNTER — Other Ambulatory Visit: Payer: Self-pay | Admitting: Podiatry

## 2019-09-06 ENCOUNTER — Ambulatory Visit: Payer: BLUE CROSS/BLUE SHIELD | Admitting: Podiatry

## 2019-09-06 ENCOUNTER — Encounter: Payer: Self-pay | Admitting: Podiatry

## 2019-09-06 ENCOUNTER — Other Ambulatory Visit: Payer: Self-pay

## 2019-09-06 ENCOUNTER — Ambulatory Visit (INDEPENDENT_AMBULATORY_CARE_PROVIDER_SITE_OTHER): Payer: PPO

## 2019-09-06 VITALS — Temp 98.0°F

## 2019-09-06 DIAGNOSIS — M79671 Pain in right foot: Secondary | ICD-10-CM

## 2019-09-06 DIAGNOSIS — M7661 Achilles tendinitis, right leg: Secondary | ICD-10-CM

## 2019-09-06 NOTE — Patient Instructions (Signed)

## 2019-09-06 NOTE — Progress Notes (Signed)
Subjective:   Patient ID: Karen Clay, female   DOB: 64 y.o.   MRN: [id number]   HPI Patient states she is developed an acute inflammation on the back of the right heel and states it is been sore and hard to work with and walk with   ROS      Objective:  Physical Exam  Neurovascular status intact with medial side of the posterior heel showing inflammation center lateral is feeling good     Assessment:  1 area of acute Achilles tendinitis right     Plan:  Discussed treatments and at this point did sterile prep and I explained chances for rupture I then went ahead and did an injection 3 mg Dexasone Kenalog 5 mg Xylocaine advised on reduced activity and patient tolerated well

## 2019-09-11 ENCOUNTER — Other Ambulatory Visit: Payer: Self-pay | Admitting: Family Medicine

## 2019-09-29 ENCOUNTER — Other Ambulatory Visit: Payer: Self-pay | Admitting: Family Medicine

## 2019-10-02 DIAGNOSIS — F331 Major depressive disorder, recurrent, moderate: Secondary | ICD-10-CM | POA: Diagnosis not present

## 2019-10-02 DIAGNOSIS — F411 Generalized anxiety disorder: Secondary | ICD-10-CM | POA: Diagnosis not present

## 2019-10-02 DIAGNOSIS — N943 Premenstrual tension syndrome: Secondary | ICD-10-CM | POA: Diagnosis not present

## 2019-10-17 DIAGNOSIS — D509 Iron deficiency anemia, unspecified: Secondary | ICD-10-CM | POA: Diagnosis not present

## 2019-10-23 ENCOUNTER — Ambulatory Visit (INDEPENDENT_AMBULATORY_CARE_PROVIDER_SITE_OTHER): Payer: PPO

## 2019-10-23 ENCOUNTER — Other Ambulatory Visit: Payer: Self-pay

## 2019-10-23 VITALS — BP 124/72 | HR 97 | Temp 98.2°F | Resp 16 | Ht 63.0 in | Wt 174.0 lb

## 2019-10-23 DIAGNOSIS — Z0000 Encounter for general adult medical examination without abnormal findings: Secondary | ICD-10-CM | POA: Diagnosis not present

## 2019-10-24 NOTE — Progress Notes (Signed)
Subjective:   Karen Clay is a 64 y.o. female who presents for Medicare Annual (Subsequent) preventive examination.  This wellness visit is conducted by a nurse.  The patient's medications were reviewed and reconciled since the patient's last visit.  History details were provided by the patient.  The history appears to be reliable.    Patient's last AWV was one year ago.   Medical History: Patient history and Family history was reviewed  Medications, Allergies, and preventative health maintenance was reviewed and updated.  Review of Systems:  Review of Systems  Constitutional: Negative.   HENT: Negative.   Eyes: Negative.   Respiratory: Negative.  Negative for cough, chest tightness and shortness of breath.   Cardiovascular: Negative.  Negative for chest pain and palpitations.  Gastrointestinal: Negative.   Genitourinary: Negative.   Musculoskeletal: Negative.   Neurological: Negative.  Negative for dizziness and headaches.  Psychiatric/Behavioral: Negative.  Negative for confusion and suicidal ideas.    Cardiac Risk Factors include: advanced age (>53men, >89 women);obesity (BMI >30kg/m2)     Objective:     Vitals: BP 124/72 (BP Location: Left Arm, Patient Position: Sitting, Cuff Size: Large)   Pulse 97   Temp 98.2 F (36.8 C) (Temporal)   Resp 16   Ht 5\' 3"  (1.6 m)   Wt 174 lb (78.9 kg)   SpO2 97%   BMI 30.82 kg/m   Body mass index is 30.82 kg/m.  Advanced Directives 10/23/2019 07/09/2019  Does Patient Have a Medical Advance Directive? Yes Yes  Type of Advance Directive Living will;Healthcare Power of [place];Living will  Does patient want to make changes to medical advance directive? No - Patient declined -  Copy of [place] in Chart? No - copy requested -    Tobacco Social History   Tobacco Use  Smoking Status Former Smoker  Smokeless Tobacco Never Used     Counseling given: Not  Answered   Clinical Intake:  Pre-visit preparation completed: Yes  Pain : No/denies pain Pain Score: 0-No pain     BMI - recorded: 30.82 Nutritional Status: BMI > 30  Obese Nutritional Risks: None Diabetes: No  How often do you need to have someone help you when you read instructions, pamphlets, or other written materials from your doctor or pharmacy?: 1 - Never  Interpreter Needed?: No     Past Medical History:  Diagnosis Date  . Atrophy of thyroid   . Benign carcinoid tumor of the stomach   . CTS (carpal tunnel syndrome)   . Essential hypertension   . Major depressive disorder   . Melena   . Mixed hyperlipidemia   . Nonalcoholic fatty liver disease   . Osteoarthritis   . Sleep apnea   . Vitamin B deficiency    Past Surgical History:  Procedure Laterality Date  . CESAREAN SECTION    . REPLACEMENT TOTAL KNEE Right   . unicompartmental knee replacement Left    Family History  Problem Relation Age of Onset  . Aneurysm Mother   . Heart failure Father   . CAD Father    Social History   Socioeconomic History  . Marital status: Married    Spouse name: Not on file  . Number of children: 3  . Years of education: Not on file  . Highest education level: Not on file  Occupational History  . Occupation: self employed  Tobacco Use  . Smoking status: Former Research scientist (life sciences)  . Smokeless tobacco:  Never Used  Vaping Use  . Vaping Use: Never used  Substance and Sexual Activity  . Alcohol use: Yes    Alcohol/week: 14.0 standard drinks    Types: 14 Glasses of wine per week    Comment: averages 2 glasses a night  . Drug use: No  . Sexual activity: Not on file  Other Topics Concern  . Not on file  Social History Narrative  . Not on file   Social Determinants of Health   Financial Resource Strain:   . Difficulty of Paying Living Expenses:   Food Insecurity:   . Worried About Charity fundraiser in the Last Year:   . Arboriculturist in the Last Year:   Transportation  Needs:   . Film/video editor (Medical):   Marland Kitchen Lack of Transportation (Non-Medical):   Physical Activity:   . Days of Exercise per Week:   . Minutes of Exercise per Session:   Stress:   . Feeling of Stress :   Social Connections:   . Frequency of Communication with Friends and Family:   . Frequency of Social Gatherings with Friends and Family:   . Attends Religious Services:   . Active Member of Clubs or Organizations:   . Attends Archivist Meetings:   Marland Kitchen Marital Status:     Outpatient Encounter Medications as of 10/23/2019  Medication Sig  . buPROPion (WELLBUTRIN XL) 300 MG 24 hr tablet   . cyanocobalamin (,VITAMIN B-12,) 1000 MCG/ML injection Inject 1,000 mcg into the muscle every 30 (thirty) days.  . Cyanocobalamin (B-12 COMPLIANCE INJECTION IJ) Inject 1,000 mcg as directed every 30 (thirty) days.  Marland Kitchen FLUoxetine (PROZAC) 20 MG capsule Take 20 mg by mouth daily.  . hydrochlorothiazide (MICROZIDE) 12.5 MG capsule TAKE 1 CAPSULE BY MOUTH ONCE A DAY  . levothyroxine (SYNTHROID) 75 MCG tablet TAKE 1 TABLET BY MOUTH ONCE DAILY  . losartan (COZAAR) 50 MG tablet TAKE 1 TABLET BY MOUTH DAILY  . potassium chloride SA (KLOR-CON) 20 MEQ tablet TAKE 1 TABLET DAILY  . rosuvastatin (CRESTOR) 5 MG tablet TAKE 1 TABLET BY MOUTH DAILY  . tiZANidine (ZANAFLEX) 2 MG tablet Take 1 tablet (2 mg total) by mouth 3 (three) times daily.  Marland Kitchen triamcinolone ointment (KENALOG) 0.1 % Apply 1 application topically 2 (two) times daily.   No facility-administered encounter medications on file as of 10/23/2019.    Activities of Daily Living In your present state of health, do you have any difficulty performing the following activities: 10/23/2019  Hearing? N  Vision? N  Difficulty concentrating or making decisions? N  Walking or climbing stairs? N  Dressing or bathing? N  Doing errands, shopping? N  Preparing Food and eating ? N  Using the Toilet? N  In the past six months, have you accidently  leaked urine? N  Do you have problems with loss of bowel control? N  Managing your Medications? N  Managing your Finances? N  Housekeeping or managing your Housekeeping? N  Some recent data might be hidden    Patient Care Team: Rochel Brome, MD as PCP - General (Family Medicine) Misenheimer, Christia Reading, MD as Consulting Physician (Unknown Physician Specialty)    Assessment:   This is a routine wellness examination for [place].  Exercise Activities and Dietary recommendations Current Exercise Habits: Structured exercise class, Time (Minutes): 60, Frequency (Times/Week): 2, Weekly Exercise (Minutes/Week): 120, Intensity: Moderate  Goals   None     Fall Risk Fall Risk  10/23/2019 07/09/2019  Falls in the past year? 0 0  Number falls in past yr: 0 0  Injury with Fall? 0 0  Follow up Falls evaluation completed -   Is the patient's home free of loose throw rugs in walkways, pet beds, electrical cords, etc?   yes      Grab bars in the bathroom? no      Handrails on the stairs?   yes      Adequate lighting?   yes   Depression Screen PHQ 2/9 Scores 10/23/2019 07/09/2019  PHQ - 2 Score 0 0     Cognitive Function     6CIT Screen 10/24/2019  What Year? 0 points  What month? 0 points  What time? 0 points  Count back from 20 0 points  Months in reverse 0 points  Repeat phrase 0 points  Total Score 0    Immunization History  Administered Date(s) Administered  . Influenza-Unspecified 01/08/2019  . PFIZER SARS-COV-2 Vaccination 05/27/2019, 06/16/2019  . Pneumococcal Conjugate-13 08/21/2014  . Pneumococcal Polysaccharide-23 03/23/2012  . Td 08/27/2010  . Tdap 08/27/2010  . Zoster Recombinat (Shingrix) 02/08/2018   Screening Tests Health Maintenance  Topic Date Due  . Hepatitis C Screening  Never done  . FOOT EXAM  Never done  . DEXA SCAN  Never done  . PNA vac Low Risk Adult (2 of 2 - PPSV23) 06/02/2018  . HEMOGLOBIN A1C  12/29/2018  . INFLUENZA VACCINE  12/09/2019  .  OPHTHALMOLOGY EXAM  03/10/2020  . MAMMOGRAM  07/30/2020  . TETANUS/TDAP  08/26/2020  . COLONOSCOPY  06/14/2027  . COVID-19 Vaccine  Completed    Cancer Screenings: Lung: Low Dose CT Chest recommended if Age 45-80 years, 30 pack-year currently smoking OR have quit w/in 15years. Patient does not qualify. Breast:  Up to date on Mammogram? Yes   Up to date of Bone Density/Dexa? Yes Colorectal: Yes     Plan:    Counseling was provided today regarding the following topics: healthy eating habits, home safety, vitamin and mineral supplementation (calcium and Vit D), regular exercise, breast self-exam, tobacco avoidance, limitation of alcohol intake, use of seat belts, firearm safety, and fall prevention.  Annual recommendations include: influenza vaccine, dental cleanings, and eye exams.    I have personally reviewed and noted the following in the patient's chart:   . Medical and social history . Use of alcohol, tobacco or illicit drugs  . Current medications and supplements . Functional ability and status . Nutritional status . Physical activity . Advanced directives . List of other physicians . Hospitalizations, surgeries, and ER visits in previous 12 months . Vitals . Screenings to include cognitive, depression, and falls . Referrals and appointments  In addition, I have reviewed and discussed with patient certain preventive protocols, quality metrics, and best practice recommendations. A written personalized care plan for preventive services as well as general preventive health recommendations were provided to patient.     Erie Noe, LPN  1/84/0375

## 2019-10-24 NOTE — Patient Instructions (Addendum)
 Fall Prevention in the Home, Adult Falls can cause injuries. They can happen to people of all ages. There are many things you can do to make your home safe and to help prevent falls. Ask for help when making these changes, if needed. What actions can I take to prevent falls? General Instructions  Use good lighting in all rooms. Replace any light bulbs that burn out.  Turn on the lights when you go into a dark area. Use night-lights.  Keep items that you use often in easy-to-reach places. Lower the shelves around your home if necessary.  Set up your furniture so you have a clear path. Avoid moving your furniture around.  Do not have throw rugs and other things on the floor that can make you trip.  Avoid walking on wet floors.  If any of your floors are uneven, fix them.  Add color or contrast paint or tape to clearly mark and help you see: ? Any grab bars or handrails. ? First and last steps of stairways. ? Where the edge of each step is.  If you use a stepladder: ? Make sure that it is fully opened. Do not climb a closed stepladder. ? Make sure that both sides of the stepladder are locked into place. ? Ask someone to hold the stepladder for you while you use it.  If there are any pets around you, be aware of where they are. What can I do in the bathroom?      Keep the floor dry. Clean up any water that spills onto the floor as soon as it happens.  Remove soap buildup in the tub or shower regularly.  Use non-skid mats or decals on the floor of the tub or shower.  Attach bath mats securely with double-sided, non-slip rug tape.  If you need to sit down in the shower, use a plastic, non-slip stool.  Install grab bars by the toilet and in the tub and shower. Do not use towel bars as grab bars. What can I do in the bedroom?  Make sure that you have a light by your bed that is easy to reach.  Do not use any sheets or blankets that are too big for your bed. They should  not hang down onto the floor.  Have a firm chair that has side arms. You can use this for support while you get dressed. What can I do in the kitchen?  Clean up any spills right away.  If you need to reach something above you, use a strong step stool that has a grab bar.  Keep electrical cords out of the way.  Do not use floor polish or wax that makes floors slippery. If you must use wax, use non-skid floor wax. What can I do with my stairs?  Do not leave any items on the stairs.  Make sure that you have a light switch at the top of the stairs and the bottom of the stairs. If you do not have them, ask someone to add them for you.  Make sure that there are handrails on both sides of the stairs, and use them. Fix handrails that are broken or loose. Make sure that handrails are as long as the stairways.  Install non-slip stair treads on all stairs in your home.  Avoid having throw rugs at the top or bottom of the stairs. If you do have throw rugs, attach them to the floor with carpet tape.  Choose a carpet that   does not hide the edge of the steps on the stairway.  Check any carpeting to make sure that it is firmly attached to the stairs. Fix any carpet that is loose or worn. What can I do on the outside of my home?  Use bright outdoor lighting.  Regularly fix the edges of walkways and driveways and fix any cracks.  Remove anything that might make you trip as you walk through a door, such as a raised step or threshold.  Trim any bushes or trees on the path to your home.  Regularly check to see if handrails are loose or broken. Make sure that both sides of any steps have handrails.  Install guardrails along the edges of any raised decks and porches.  Clear walking paths of anything that might make someone trip, such as tools or rocks.  Have any leaves, snow, or ice cleared regularly.  Use sand or salt on walking paths during winter.  Clean up any spills in your garage right  away. This includes grease or oil spills. What other actions can I take?  Wear shoes that: ? Have a low heel. Do not wear high heels. ? Have rubber bottoms. ? Are comfortable and fit you well. ? Are closed at the toe. Do not wear open-toe sandals.  Use tools that help you move around (mobility aids) if they are needed. These include: ? Canes. ? Walkers. ? Scooters. ? Crutches.  Review your medicines with your doctor. Some medicines can make you feel dizzy. This can increase your chance of falling. Ask your doctor what other things you can do to help prevent falls. Where to find more information  Centers for Disease Control and Prevention, [employer]AD[employer]: https://cdc.gov  National Institute on Aging: https://go4life.nia.nih.gov Contact a doctor if:  You are afraid of falling at home.  You feel weak, drowsy, or dizzy at home.  You fall at home. Summary  There are many simple things that you can do to make your home safe and to help prevent falls.  Ways to make your home safe include removing tripping hazards and installing grab bars in the bathroom.  Ask for help when making these changes in your home. This information is not intended to replace advice given to you by your health care provider. Make sure you discuss any questions you have with your health care provider. Document Revised: 08/17/2018 Document Reviewed: 12/09/2016 Elsevier Patient Education  2020 Elsevier Inc.   Health Maintenance, Female Adopting a healthy lifestyle and getting preventive care are important in promoting health and wellness. Ask your health care provider about:  The right schedule for you to have regular tests and exams.  Things you can do on your own to prevent diseases and keep yourself healthy. What should I know about diet, weight, and exercise? Eat a healthy diet   Eat a diet that includes plenty of vegetables, fruits, low-fat dairy products, and lean protein.  Do not eat a lot of foods  that are high in solid fats, added sugars, or sodium. Maintain a healthy weight Body mass index (BMI) is used to identify weight problems. It estimates body fat based on height and weight. Your health care provider can help determine your BMI and help you achieve or maintain a healthy weight. Get regular exercise Get regular exercise. This is one of the most important things you can do for your health. Most adults should:  Exercise for at least 150 minutes each week. The exercise should increase your heart rate   and make you sweat (moderate-intensity exercise).  Do strengthening exercises at least twice a week. This is in addition to the moderate-intensity exercise.  Spend less time sitting. Even light physical activity can be beneficial. Watch cholesterol and blood lipids Have your blood tested for lipids and cholesterol at 66 years of age, then have this test every 5 years. Have your cholesterol levels checked more often if:  Your lipid or cholesterol levels are high.  You are older than 66 years of age.  You are at high risk for heart disease. What should I know about cancer screening? Depending on your health history and family history, you may need to have cancer screening at various ages. This may include screening for:  Breast cancer.  Cervical cancer.  Colorectal cancer.  Skin cancer.  Lung cancer. What should I know about heart disease, diabetes, and high blood pressure? Blood pressure and heart disease  High blood pressure causes heart disease and increases the risk of stroke. This is more likely to develop in people who have high blood pressure readings, are of African descent, or are overweight.  Have your blood pressure checked: ? Every 3-5 years if you are 18-39 years of age. ? Every year if you are 40 years old or older. Diabetes Have regular diabetes screenings. This checks your fasting blood sugar level. Have the screening done:  Once every three years after  age 40 if you are at a normal weight and have a low risk for diabetes.  More often and at a younger age if you are overweight or have a high risk for diabetes. What should I know about preventing infection? Hepatitis B If you have a higher risk for hepatitis B, you should be screened for this virus. Talk with your health care provider to find out if you are at risk for hepatitis B infection. Hepatitis C Testing is recommended for:  Everyone born from 1945 through 1965.  Anyone with known risk factors for hepatitis C. Sexually transmitted infections (STIs)  Get screened for STIs, including gonorrhea and chlamydia, if: ? You are sexually active and are younger than 66 years of age. ? You are older than 66 years of age and your health care provider tells you that you are at risk for this type of infection. ? Your sexual activity has changed since you were last screened, and you are at increased risk for chlamydia or gonorrhea. Ask your health care provider if you are at risk.  Ask your health care provider about whether you are at high risk for HIV. Your health care provider may recommend a prescription medicine to help prevent HIV infection. If you choose to take medicine to prevent HIV, you should first get tested for HIV. You should then be tested every 3 months for as long as you are taking the medicine. Pregnancy  If you are about to stop having your period (premenopausal) and you may become pregnant, seek counseling before you get pregnant.  Take 400 to 800 micrograms (mcg) of folic acid every day if you become pregnant.  Ask for birth control (contraception) if you want to prevent pregnancy. Osteoporosis and menopause Osteoporosis is a disease in which the bones lose minerals and strength with aging. This can result in bone fractures. If you are 65 years old or older, or if you are at risk for osteoporosis and fractures, ask your health care provider if you should:  Be screened for  bone loss.  Take a calcium or vitamin   D supplement to lower your risk of fractures.  Be given hormone replacement therapy (HRT) to treat symptoms of menopause. Follow these instructions at home: Lifestyle  Do not use any products that contain nicotine or tobacco, such as cigarettes, e-cigarettes, and chewing tobacco. If you need help quitting, ask your health care provider.  Do not use street drugs.  Do not share needles.  Ask your health care provider for help if you need support or information about quitting drugs. Alcohol use  Do not drink alcohol if: ? Your health care provider tells you not to drink. ? You are pregnant, may be pregnant, or are planning to become pregnant.  If you drink alcohol: ? Limit how much you use to 0-1 drink a day. ? Limit intake if you are breastfeeding.  Be aware of how much alcohol is in your drink. In the [place] one drink equals one 12 oz bottle of beer (355 mL), one 5 oz glass of wine (148 mL), or one 1 oz glass of hard liquor (44 mL). General instructions  Schedule regular health, dental, and eye exams.  Stay current with your vaccines.  Tell your health care provider if: ? You often feel depressed. ? You have ever been abused or do not feel safe at home. Summary  Adopting a healthy lifestyle and getting preventive care are important in promoting health and wellness.  Follow your health care provider's instructions about healthy diet, exercising, and getting tested or screened for diseases.  Follow your health care provider's instructions on monitoring your cholesterol and blood pressure. This information is not intended to replace advice given to you by your health care provider. Make sure you discuss any questions you have with your health care provider. Document Revised: 04/19/2018 Document Reviewed: 04/19/2018 Elsevier Patient Education  2020 Elsevier Inc.  

## 2019-11-17 ENCOUNTER — Other Ambulatory Visit: Payer: Self-pay | Admitting: Physician Assistant

## 2019-11-17 ENCOUNTER — Other Ambulatory Visit: Payer: Self-pay | Admitting: Family Medicine

## 2019-12-11 ENCOUNTER — Other Ambulatory Visit: Payer: Self-pay | Admitting: Physician Assistant

## 2019-12-31 ENCOUNTER — Other Ambulatory Visit: Payer: Self-pay | Admitting: Physician Assistant

## 2020-01-07 ENCOUNTER — Encounter: Payer: Self-pay | Admitting: Family Medicine

## 2020-01-07 NOTE — Progress Notes (Signed)
Virtual Visit via Telephone Note   This visit type was conducted due to national recommendations for restrictions regarding the COVID-19 Pandemic (e.g. social distancing) in an effort to limit this patient's exposure and mitigate transmission in our community.  Due to her co-morbid illnesses, this patient is at least at moderate risk for complications without adequate follow up.  This format is felt to be most appropriate for this patient at this time.  The patient did not have access to video technology/had technical difficulties with video requiring transitioning to audio format only (telephone).  All issues noted in this document were discussed and addressed.  No physical exam could be performed with this format.  Patient verbally consented to a telehealth visit.   Patient Location:home Provider Location:office  PCP:  Rochel Brome, MD   Evaluation Performed:  Follow-Up Visit   Subjective:  Patient ID: Karen Clay, female    DOB: 1955-06-28  Age: 64 y.o. MRN: [id number]  Chief Complaint  Patient presents with  . Depression  . Hyperlipidemia  . Hypertension  . Diabetes    HPI Patient is a 64 year old white female who presents for follow-up of hypertension, hyperlipidemia, depression, and diabetes.    Her diabetes has been in excellent control her last A1c was less than 5.  She does not check her sugars.  She denies any polyphagia, polyuria,, or polydipsia.  She eats healthy     Her depression is well controlled on Wellbutrin and Prozac and has been for several years.  She does not wish to discontinue these as they have worked very well for her.  Hypertension: Controlled losartan and hctz. Taking potassium daily.  Hyperlipidemia: Taking crestor. Eating healthy and exercising.  B12 deficiency: Takes B12 shots monthly.     Social History   Socioeconomic History  . Marital status: Married    Spouse name: Not on file  . Number of children: 3  . Years of  education: Not on file  . Highest education level: Not on file  Occupational History  . Occupation: self employed  Tobacco Use  . Smoking status: Former Research scientist (life sciences)  . Smokeless tobacco: Never Used  Vaping Use  . Vaping Use: Never used  Substance and Sexual Activity  . Alcohol use: Yes    Alcohol/week: 14.0 standard drinks    Types: 14 Glasses of wine per week    Comment: averages 2 glasses a night  . Drug use: No  . Sexual activity: Not on file  Other Topics Concern  . Not on file  Social History Narrative  . Not on file   Social Determinants of Health   Financial Resource Strain:   . Difficulty of Paying Living Expenses: Not on file  Food Insecurity:   . Worried About Charity fundraiser in the Last Year: Not on file  . Ran Out of Food in the Last Year: Not on file  Transportation Needs:   . Lack of Transportation (Medical): Not on file  . Lack of Transportation (Non-Medical): Not on file  Physical Activity:   . Days of Exercise per Week: Not on file  . Minutes of Exercise per Session: Not on file  Stress:   . Feeling of Stress : Not on file  Social Connections:   . Frequency of Communication with Friends and Family: Not on file  . Frequency of Social Gatherings with Friends and Family: Not on file  . Attends Religious Services: Not on file  . Active Member of Clubs or  Organizations: Not on file  . Attends Archivist Meetings: Not on file  . Marital Status: Not on file   Past Medical History:  Diagnosis Date  . Atrophy of thyroid   . Benign carcinoid tumor of the stomach   . CTS (carpal tunnel syndrome)   . Essential hypertension   . Major depressive disorder   . Melena   . Mixed hyperlipidemia   . Nonalcoholic fatty liver disease   . Osteoarthritis   . Sleep apnea   . Vitamin B deficiency    Family History  Problem Relation Age of Onset  . Aneurysm Mother   . Heart failure Father   . CAD Father     Review of Systems  Constitutional: Negative  for chills, fatigue and fever.  HENT: Negative for congestion, ear pain and sore throat.   Respiratory: Negative for cough and shortness of breath.   Cardiovascular: Negative for chest pain.  Gastrointestinal: Positive for constipation. Negative for abdominal pain, diarrhea, nausea and vomiting.  Musculoskeletal: Negative for arthralgias, back pain and myalgias.       Bilateral in lumbar and buttocks. Stretching helps. Improved but not fully resolved.   Neurological: Negative for dizziness and headaches.  Psychiatric/Behavioral: Negative for dysphoric mood. The patient is not nervous/anxious.      Objective:  Temp 98.6 F (37 C)   Ht 5\' 3"  (1.6 m)   Wt 164 lb (74.4 kg)   BMI 29.05 kg/m   BP/Weight 01/09/2020 9/89/2119 08/09/7406  Systolic BP - 144 818  Diastolic BP - 72 70  Wt. (Lbs) 164 174 176  BMI 29.05 30.82 31.18    Physical Exam Vitals reviewed.  Pulmonary:     Effort: Pulmonary effort is normal.  Neurological:     Mental Status: She is oriented to person, place, and time.  Psychiatric:        Mood and Affect: Mood normal.     Lab Results  Component Value Date   WBC 6.6 07/09/2019   HGB 13.4 07/09/2019   HCT 40.1 07/09/2019   PLT 206 07/09/2019   GLUCOSE 101 (H) 07/09/2019   CHOL 169 07/09/2019   TRIG 140 07/09/2019   HDL 60 07/09/2019   LDLCALC 85 07/09/2019   AST 31 07/09/2019   NA 140 07/09/2019   K 5.1 07/09/2019   CL 101 07/09/2019   CREATININE 0.64 07/09/2019   BUN 23 07/09/2019      Assessment & Plan:  1. Essential hypertension, benign Well controlled.  No changes to medicines.  Continue to work on eating a healthy diet and exercise.   2. Dyslipidemia associated with type 2 diabetes mellitus ([place]) Well controlled.  No changes to medicines.  Continue to work on eating a healthy diet and exercise.   3. Mixed hyperlipidemia Well controlled.  No changes to medicines.  Continue to work on eating a healthy diet and exercise.   4.  Hypothyroidism  The current medical regimen is effective;  continue present plan and medications.  5. B12 deficiency - The current medical regimen is effective;  continue present plan and medications.     Time:   Today, I have spent 15 minutes with the patient with telehealth technology discussing the above problems.    Follow Up:  In Person in 6 month(s)  Follow-up: No follow-ups on file.  AVS was given to patient prior to departure.  Rochel Brome [facility] [phone]

## 2020-01-09 ENCOUNTER — Telehealth (INDEPENDENT_AMBULATORY_CARE_PROVIDER_SITE_OTHER): Payer: PPO | Admitting: Family Medicine

## 2020-01-09 VITALS — Temp 98.6°F | Ht 63.0 in | Wt 164.0 lb

## 2020-01-09 DIAGNOSIS — E785 Hyperlipidemia, unspecified: Secondary | ICD-10-CM | POA: Diagnosis not present

## 2020-01-09 DIAGNOSIS — E1169 Type 2 diabetes mellitus with other specified complication: Secondary | ICD-10-CM | POA: Diagnosis not present

## 2020-01-09 DIAGNOSIS — E782 Mixed hyperlipidemia: Secondary | ICD-10-CM | POA: Diagnosis not present

## 2020-01-09 DIAGNOSIS — I10 Essential (primary) hypertension: Secondary | ICD-10-CM

## 2020-02-12 ENCOUNTER — Other Ambulatory Visit: Payer: Self-pay | Admitting: Family Medicine

## 2020-02-19 ENCOUNTER — Encounter: Payer: Self-pay | Admitting: Family Medicine

## 2020-02-19 ENCOUNTER — Ambulatory Visit (INDEPENDENT_AMBULATORY_CARE_PROVIDER_SITE_OTHER): Payer: PPO

## 2020-02-19 DIAGNOSIS — Z23 Encounter for immunization: Secondary | ICD-10-CM | POA: Diagnosis not present

## 2020-02-19 NOTE — Progress Notes (Signed)
   Covid-19 Vaccination Clinic  Name:  Donisha Hoch    MRN: [id number] DOB: 08-15-1955  02/19/2020  Ms. Admire was observed post Covid-19 immunization for 15 minutes without incident. She was provided with Vaccine Information Sheet and instruction to access the V-Safe system.   Ms. Lucente was instructed to call 911 with any severe reactions post vaccine: Marland Kitchen Difficulty breathing  . Swelling of face and throat  . A fast heartbeat  . A bad rash all over body  . Dizziness and weakness

## 2020-02-26 ENCOUNTER — Other Ambulatory Visit: Payer: Self-pay | Admitting: Family Medicine

## 2020-03-11 ENCOUNTER — Telehealth: Payer: Self-pay | Admitting: Family Medicine

## 2020-03-11 NOTE — Chronic Care Management (AMB) (Signed)
  Chronic Care Management   Note  03/11/2020 Name: Kattaleya Alia MRN: [id number] DOB: [birth year]  Robb Matar Aunesty Tyson is a 64 y.o. year old female who is a primary care patient of Cox, Kirsten, MD. I reached out to [name] by phone today in response to a referral sent by Ms. Fransico Setters Easler's PCP, Rochel Brome, MD.   Ms. Renninger was given information about Chronic Care Management services today including:  1. CCM service includes personalized support from designated clinical staff supervised by her physician, including individualized plan of care and coordination with other care providers 2. 24/7 contact phone numbers for assistance for urgent and routine care needs. 3. Service will only be billed when office clinical staff spend 20 minutes or more in a month to coordinate care. 4. Only one practitioner may furnish and bill the service in a calendar month. 5. The patient may stop CCM services at any time (effective at the end of the month) by phone call to the office staff.   Patient agreed to services and verbal consent obtained.   Follow up plan:   [place]

## 2020-03-17 ENCOUNTER — Other Ambulatory Visit: Payer: Self-pay | Admitting: Physician Assistant

## 2020-03-21 NOTE — Chronic Care Management (AMB) (Signed)
Chronic Care Management Pharmacy  Name: Karen Clay  MRN: [id number] DOB: 22-Jun-1955  Chief Complaint/ HPI  Karen Clay,  64 y.o. , female presents for their Initial CCM visit with the clinical pharmacist via telephone due to COVID-19 Pandemic.  PCP : Rochel Brome, MD  Their chronic conditions include: hypertension, dyslipidemia, OA of knees, lumbar pain, atrophy of thyroid, major depressive disorder. .   Office Visits: 02/19/2020 - Flu and [place] 01/09/2020 - well controlled no changes.  10/23/2019 - AWV with nursing.   Consult Visit: 10/17/2019 - Oncology/Hematology - iron deficiecny anemia. Patient doing very well. Return care to PCP.   Medications: Outpatient Encounter Medications as of 03/26/2020  Medication Sig  . buPROPion (WELLBUTRIN XL) 300 MG 24 hr tablet Take 300 mg by mouth daily.   . cyanocobalamin (,VITAMIN B-12,) 1000 MCG/ML injection INJECT 1ML INTRAMUSCULARLY ONCE A MONTH  . FLUoxetine (PROZAC) 20 MG capsule Take 40 mg by mouth daily.   . hydrochlorothiazide (MICROZIDE) 12.5 MG capsule TAKE 1 CAPSULE BY MOUTH ONCE A DAY  . levothyroxine (SYNTHROID) 75 MCG tablet TAKE 1 TABLET BY MOUTH ONCE DAILY  . losartan (COZAAR) 50 MG tablet TAKE 1 TABLET BY MOUTH DAILY  . MISC NATURAL PRODUCTS PO Take 1 tablet by mouth daily. Blood builder supplement from health food store  . MISC NATURAL PRODUCTS PO Take 2 tablets by mouth daily. Biocell Collagen for joint/skin/hair  . potassium chloride SA (KLOR-CON) 20 MEQ tablet TAKE 1 TABLET BY MOUTH DAILY  . Probiotic Product (PHILLIPS COLON HEALTH PO) Take 1 capsule by mouth daily.  . rosuvastatin (CRESTOR) 5 MG tablet TAKE 1 TABLET BY MOUTH DAILY  . tiZANidine (ZANAFLEX) 2 MG tablet Take 1 tablet (2 mg total) by mouth 3 (three) times daily. (Patient not taking: Reported on 03/26/2020)  . triamcinolone ointment (KENALOG) 0.1 % Apply 1 application topically 2 (two) times daily. (Patient not  taking: Reported on 03/26/2020)  . [DISCONTINUED] Cyanocobalamin (B-12 COMPLIANCE INJECTION IJ) Inject 1,000 mcg as directed every 30 (thirty) days.  . [DISCONTINUED] rosuvastatin (CRESTOR) 5 MG tablet TAKE 1 TABLET BY MOUTH DAILY   No facility-administered encounter medications on file as of 03/26/2020.   Allergies  Allergen Reactions  . Ace Inhibitors     cough  . Niaspan [Niacin]   . Zetia [Ezetimibe]    SDOH Screenings   Alcohol Screen:   . Last Alcohol Screening Score (AUDIT): Not on file  Depression (PHQ2-9): Low Risk   . PHQ-2 Score: 0  Financial Resource Strain:   . Difficulty of Paying Living Expenses: Not on file  Food Insecurity: No Food Insecurity  . Worried About Charity fundraiser in the Last Year: Never true  . Ran Out of Food in the Last Year: Never true  Housing: Low Risk   . Last Housing Risk Score: 0  Physical Activity: Insufficiently Active  . Days of Exercise per Week: 2 days  . Minutes of Exercise per Session: 60 min  Social Connections:   . Frequency of Communication with Friends and Family: Not on file  . Frequency of Social Gatherings with Friends and Family: Not on file  . Attends Religious Services: Not on file  . Active Member of Clubs or Organizations: Not on file  . Attends Archivist Meetings: Not on file  . Marital Status: Not on file  Stress:   . Feeling of Stress : Not on file  Tobacco Use: Medium Risk  . Smoking Tobacco  Use: Former Smoker  . Smokeless Tobacco Use: Never Used  Transportation Needs: No Transportation Needs  . Lack of Transportation (Medical): No  . Lack of Transportation (Non-Medical): No    Current Diagnosis/Assessment:  Goals Addressed            This Visit's Progress   . Pharmacy Care Plan       CARE PLAN ENTRY (see longitudinal plan of care for additional care plan information)  Current Barriers:  . Chronic Disease Management support, education, and care coordination needs related to  Hypertension, Hyperlipidemia, and Depression   Hypertension BP Readings from Last 3 Encounters:  10/23/19 124/72  07/09/19 126/70  12/17/16 123/74   . Pharmacist Clinical Goal(s): o Over the next 90 days, patient will work with PharmD and providers to maintain BP goal <130/80 . Current regimen:   Hydrochlorothiazide 12.5 mg daily  Losartan 50 mg daily . Interventions: o Discussed diet and exercise.  o Coordinating combination losartan-hctz 50-12 mg tablet sent to pharmacy to reduce pill burden.  . Patient self care activities - Over the next 90 days, patient will: o Keep up the good work with diet and exercise.  o Begin taking combination losartan-hctz 50-12 mg daily when out of current supply.   Hyperlipidemia Lab Results  Component Value Date/Time   LDLCALC 85 07/09/2019 10:30 AM   . Pharmacist Clinical Goal(s): o Over the next 90 days, patient will work with PharmD and providers to maintain LDL goal < 100 . Current regimen:  . Crestor 5 mg daily  . Interventions: o Reviewed medication adherence.  o Discussed benefits of healthy diet and exercise.  o Encouraged goal of 150 minutes each week of moderate activity.  . Patient self care activities - Over the next 90 days, patient will: o Continue taking medication as prescribed.   Depression  . Pharmacist Clinical Goal(s) o Over the next 90 days, patient will work with PharmD and providers to manage symptoms of depression  . Current regimen:  . Bupropion XL 300 mg daily . Fluoxetine 40 mg daily Interventions: o Patient sees NP at [facility] counseling for medication.  o Reports symptoms well managed on current medication.  o Discussed the benefit of continuing exercise.  . Patient self care activities - Over the next 90 days, patient will: o Continue taking medication as prescribed.   Medication management . Pharmacist Clinical Goal(s): o Over the next 90 days, patient will work with PharmD and providers to  maintain optimal medication adherence . Current pharmacy: [facility] . Interventions o Comprehensive medication review performed. o Continue current medication management strategy . Patient self care activities - Over the next 90 days, patient will: o Focus on medication adherence by using pill box o Take medications as prescribed o Report any questions or concerns to PharmD and/or provider(s)  Initial goal documentation        Hypertension   BP today is:  <130/80  Office blood pressures are  BP Readings from Last 3 Encounters:  10/23/19 124/72  07/09/19 126/70  12/17/16 123/74    Patient has failed these meds in the past: losartan-hctz 50-12 mg  Hydrochlorothiazide 12.5 mg daily  Losartan 50 mg daily  Patient checks BP at home never  Patient home BP readings are ranging: not checking  We discussed diet and exercise extensively. Patient prefers having a combination version of losartan-hctz. She previously was taking combination and likes reduced pill burden. Pharmacist requesting script sent to [facility].   Patient exercises regularly. Walks  to recreation center for water aerobics 2 times each week. Current exercise is at least 120 minutes each week and patient stays active. 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  Patient eats lot of vegetables. She limits carbohydrates and sugar in diet.   Wears CPAP each night. Reports sleeping well without interruptions other than occasional trip to the bathroom.    Plan  Continue current medications. Coordinating with Dr. Sedalia Muta to send combination losartan-hctz.   Hyperlipidemia   LDL goal < 100  Last lipids Lab Results  Component Value Date   CHOL 169 07/09/2019   HDL 60 07/09/2019   LDLCALC 85 07/09/2019   TRIG 140 07/09/2019   CHOLHDL 2.8 07/09/2019   Hepatic Function Latest Ref Rng & Units 07/09/2019  Total Protein 6.0 - 8.5 g/dL 7.6  Albumin 3.8 - 4.8 g/dL 4.9(H)  AST 0 - 40 IU/L 31  Alk  Phosphatase 39 - 117 IU/L 80  Total Bilirubin 0.0 - 1.2 mg/dL 0.3     The 55-KAJJ ASCVD risk score Denman George DC Jr., et al., 2013) is: 13%   Values used to calculate the score:     Age: 59 years     Sex: Female     Is Non-Hispanic African American: No     Diabetic: Yes     Tobacco smoker: No     Systolic Blood Pressure: 124 mmHg     Is BP treated: Yes     HDL Cholesterol: 60 mg/dL     Total Cholesterol: 169 mg/dL   Patient has failed these meds in past: none reported Patient is currently controlled on the following medications:  . Crestor 5 mg daily   We discussed:  diet and exercise extensively.   Patient does water aerobics at the [facility]. Patient reports good compliance with medication. Denies Concerns at this time.   Plan  Continue current medications   Major Depression Disorder - managed by NP at [facility]   Patient has failed these meds in past: none reported Patient is currently controlled on the following medications:  . Bupropion XL 300 mg daily . Fluoxetine 40 mg daily  We discussed: Goes to counseling with NP at [facility] a few times each year. Patient reports symptoms well controlled. We discussed the importance of taking medications daily as prescribed. Encouraged continued exercise for good control of symptoms.   Plan  Continue current medications   Hypothyroidism   No results found for: TSH, FREET4  Patient has failed these meds in past: none reported. Patient is currently controlled on the following medications:  . Levothyroxine 75 mcg daily  We discussed:  Patient's last TSH recorded was 2.19. Has scheduled next labs in a few months. Patient reports good control of symptoms.   Plan  Continue current medications  Anemia   CBC Latest Ref Rng & Units 07/09/2019  WBC 3.4 - 10.8 x10E3/uL 6.6  Hemoglobin 11.1 - 15.9 g/dL 42.3  Hematocrit 20.0 - 46.6 % 40.1  Platelets 150 - 450 x10E3/uL 206    Iron/TIBC/Ferritin/ %Sat No results found for: IRON, TIBC, FERRITIN, IRONPCTSAT  Patient has failed these meds in past: none reported Patient is currently controlled on the following medications:  Marland Kitchen Vitamin B-12 1000 mcg/ml injected IM monthly . Blood Builder daily   We discussed:  Patient takes a supplement called blood builder from health food store. Reports she can tolerate it better than iron due to constipation. Has seen hematology for anemia in the past.  Reports well controlled.   Plan  Continue current medications   Osteopenia / Osteoporosis   Last DEXA Scan: last scan not recorded. Patient indicates it has been years e since last scan.     No results found for: VD25OH    Patient has failed these meds in past: none reported Patient is currently unable to assess control on the following medications:  . None reported  We discussed:  Recommend [phone] units of vitamin D daily. Recommend 1200 mg of calcium daily from dietary and supplemental sources. Recommend weight-bearing and muscle strengthening exercises for building and maintaining bone density.   Diet: Patient has been doing keto diet. Limits carbs and sugar in her diet. Eats lots of vegetables. Eats dairy products. Eats dark leafy green vegetables.   Exercise: Patient typically does water aerobics 2 times each week. Stays pretty active.   Patient would like updated Dexa scan. Consider updated vitamin D with next labs. Patient is considering beginning Vitamin D supplement.  Patient is concerned to supplement Calcium due to history of constipation. Discussed calcium content in calcium. Patient reports eating dairy and dark leafy green vegetables as well. Encouraged patient to do an estimate of calcium daily to determine if needs additional supplementation.    Plan  Dexa Scan recommended. Consider checking Vitamin D level with next labs. Work to achieve daily goal of calcium and vitamin d.   Health Maintenance    Patient is currently controlled on the following medications:  . Potassium chloride 20 meq daily - supplementation . Triamcinolone 0.1% bid - rash/itchy skin . [place] daily - general health . Biofill collagen boosting 2 daily - supplementation  We discussed:  Patient reports tolerating supplements well.   Plan  Continue current medications  Vaccines   Reviewed and discussed patient's vaccination history.    Immunization History  Administered Date(s) Administered  . Fluad Quad(high Dose 65+) 02/19/2020  . Influenza-Unspecified 01/08/2019  . PFIZER SARS-COV-2 Vaccination 05/27/2019, 06/16/2019, 02/19/2020  . Pneumococcal Conjugate-13 08/21/2014  . Pneumococcal Polysaccharide-23 03/23/2012  . Td 08/27/2010  . Tdap 08/27/2010  . Zoster Recombinat (Shingrix) 02/08/2018    Plan  Recommended patient receive Pneumovax 23 vaccine in office.   Medication Management   Pt uses [facility] pharmacy for all medications Uses pill box? Yes   Pt endorses good compliance  Cholesterol adherence 90-99%  Hypertension adherence 100%  We discussed: Current pharmacy is preferred with insurance plan and patient is satisfied with pharmacy services. Patient has been been with current pharmacy for many years. Is pleased with service and sees no benefit of delivery/packaging at this time. Will let pharmacist know if anything changes.     Plan  Continue current medication management strategy    Follow up: 12 month phone visit

## 2020-03-25 ENCOUNTER — Other Ambulatory Visit: Payer: Self-pay | Admitting: Physician Assistant

## 2020-03-26 ENCOUNTER — Other Ambulatory Visit: Payer: Self-pay

## 2020-03-26 ENCOUNTER — Ambulatory Visit: Payer: PPO

## 2020-03-26 DIAGNOSIS — I10 Essential (primary) hypertension: Secondary | ICD-10-CM

## 2020-03-26 DIAGNOSIS — E782 Mixed hyperlipidemia: Secondary | ICD-10-CM

## 2020-03-26 DIAGNOSIS — Z13820 Encounter for screening for osteoporosis: Secondary | ICD-10-CM

## 2020-03-26 MED ORDER — LOSARTAN POTASSIUM-HCTZ 50-12.5 MG PO TABS: 1.0000 | ORAL_TABLET | Freq: Every day | ORAL | 1 refills | Status: DC

## 2020-03-26 NOTE — Patient Instructions (Addendum)
Visit Information  Thank you for your time discussing your medications. I look forward to working with you to achieve your health care goals. Below is a summary of what we talked about during our visit.   Goals Addressed            This Visit's Progress   . Pharmacy Care Plan       CARE PLAN ENTRY (see longitudinal plan of care for additional care plan information)  Current Barriers:  . Chronic Disease Management support, education, and care coordination needs related to Hypertension, Hyperlipidemia, and Depression   Hypertension BP Readings from Last 3 Encounters:  10/23/19 124/72  07/09/19 126/70  12/17/16 123/74   . Pharmacist Clinical Goal(s): o Over the next 90 days, patient will work with PharmD and providers to maintain BP goal <130/80 . Current regimen:   Hydrochlorothiazide 12.5 mg daily  Losartan 50 mg daily . Interventions: o Discussed diet and exercise.  o Coordinating combination losartan-hctz 50-12 mg tablet sent to pharmacy to reduce pill burden.  . Patient self care activities - Over the next 90 days, patient will: o Keep up the good work with diet and exercise.  o Begin taking combination losartan-hctz 50-12 mg daily when out of current supply.   Hyperlipidemia Lab Results  Component Value Date/Time   LDLCALC 85 07/09/2019 10:30 AM   . Pharmacist Clinical Goal(s): o Over the next 90 days, patient will work with PharmD and providers to maintain LDL goal < 100 . Current regimen:  . Crestor 5 mg daily  . Interventions: o Reviewed medication adherence.  o Discussed benefits of healthy diet and exercise.  o Encouraged goal of 150 minutes each week of moderate activity.  . Patient self care activities - Over the next 90 days, patient will: o Continue taking medication as prescribed.   Depression  . Pharmacist Clinical Goal(s) o Over the next 90 days, patient will work with PharmD and providers to manage symptoms of depression  . Current regimen:   . Bupropion XL 300 mg daily . Fluoxetine 40 mg daily Interventions: o Patient sees NP at [facility] counseling for medication.  o Reports symptoms well managed on current medication.  o Discussed the benefit of continuing exercise.  . Patient self care activities - Over the next 90 days, patient will: o Continue taking medication as prescribed.   Medication management . Pharmacist Clinical Goal(s): o Over the next 90 days, patient will work with PharmD and providers to maintain optimal medication adherence . Current pharmacy: [facility] . Interventions o Comprehensive medication review performed. o Continue current medication management strategy . Patient self care activities - Over the next 90 days, patient will: o Focus on medication adherence by using pill box o Take medications as prescribed o Report any questions or concerns to PharmD and/or provider(s)  Initial goal documentation        Karen Clay was given information about Chronic Care Management services today including:  1. CCM service includes personalized support from designated clinical staff supervised by her physician, including individualized plan of care and coordination with other care providers 2. 24/7 contact phone numbers for assistance for urgent and routine care needs. 3. Standard insurance, coinsurance, copays and deductibles apply for chronic care management only during months in which we provide at least 20 minutes of these services. Most insurances cover these services at 100%, however patients may be responsible for any copay, coinsurance and/or deductible if applicable. This service may help you avoid the need  for more expensive face-to-face services. 4. Only one practitioner may furnish and bill the service in a calendar month. 5. The patient may stop CCM services at any time (effective at the end of the month) by phone call to the office staff.  Patient agreed to services and verbal  consent obtained.   The patient verbalized understanding of instructions, educational materials, and care plan provided today and agreed to receive a mailed copy of patient instructions, educational materials, and care plan.  Telephone follow up appointment with pharmacy team member scheduled for: 03/2021  Sherre Poot, PharmD Clinical Pharmacist [facility] [phone] (office) [phone] (mobile)  Eating Plan for Osteoporosis Osteoporosis causes your bones to become weak and brittle. This puts you at greater risk for bone breaks (fractures) from small bumps or falls. Making changes to your diet and increasing your physical activity can help strengthen your bones and improve your overall health. Calcium and vitamin D are nutrients that play an important role in bone health. Vitamin D helps your body use calcium and strengthen bones. Therefore, it is important to get enough calcium and vitamin D as part of your eating plan for osteoporosis. What are tips for following this plan? Reading food labels  Try to get at least 1,000 milligrams (mg) of calcium each day.  Look for foods that have at least 50 mg of calcium per serving.  Talk with your health care provider about taking a calcium supplement if you do not get enough calcium from food.  Do not have more than 2,500 mg of calcium each day. This is the upper limit for food and nutritional supplements combined. Too much calcium may cause constipation and prevent you from absorbing other important nutrients.  Choose foods that contain vitamin D.  Take a daily vitamin supplement that contains 800-1,000 international units (IU) of vitamin D. The amount may be different depending on your age, body weight, ethnicity, and where you live. Talk with your dietitian or health care provider about how much vitamin D is right for you.  Avoid foods that have more than 300 mg of sodium per serving. Too much sodium can cause your body to  lose calcium.  Talk with your dietitian or health care provider about how much sodium you are allowed each day. Shopping  Do not buy foods with added salt, including: ? Salted snacks. ? Angie Fava. ? Canned soups. ? Canned meats. ? Processed meats, such as bacon or cold cuts. ? Smoked fish. Meal planning  Eat balanced meals that contain protein foods, fruits and vegetables, and foods rich in calcium and vitamin D.  Eat at least 5 servings of fruits and vegetables each day.  Eat 5-6 oz. of lean meat, poultry, fish, eggs, or beans each day. Lifestyle  Do not use any products that contain nicotine or tobacco, such as cigarettes and e-cigarettes. If you need help quitting, ask your health care provider.  If your health care provider recommends that you lose weight: ? Work with a dietitian to develop an eating plan that will help you reach your desired weight goal. ? Exercise for at least 30 minutes a day, 5 or more days a week, or as told by your health care provider.  Work with a physical therapist to develop an exercise plan that includes flexibility, balance, and strength exercises.  If you drink alcohol, limit how much you have. This means: ? 0-1 drink a day for women. ? 0-2 drinks a day for men. ? Be aware  of how much alcohol is in your drink. In the [place] one drink equals one typical bottle of beer (12 oz), one-half glass of wine (5 oz), or one shot of hard liquor (1 oz). What foods should I eat? Foods high in calcium   Yogurt. Yogurt with fruit.  Milk. Evaporated skim milk. Dry milk powder.  Calcium-fortified orange juice.  Parmesan cheese. Part-skim ricotta cheese. Natural hard cheese. Cream cheese. Cottage cheese.  Canned sardines. Canned salmon.  Calcium-treated tofu. Calcium-fortified cereal bar. Calcium-fortified cereal. Calcium-fortified graham crackers.  Cooked collard greens. Turnip greens. Broccoli. Kale.  Almonds.  White beans.  Corn tortilla. Foods  high in vitamin D  Cod liver oil. Fatty fish, such as tuna, mackerel, and salmon.  Milk. Fortified soy milk. Fortified fruit juice.  Yogurt. Margarine.  Egg yolks. Foods high in protein  Beef. Lamb. Pork tenderloin.  Chicken breast.  Tuna (canned). Fish fillet.  Tofu.  Soy beans (cooked). Soy patty. Beans (canned or cooked).  Cottage cheese.  Yogurt.  Peanut butter.  Pumpkin seeds. Nuts. Sunflower seeds.  Hard cheese.  Milk or other milk products, such as soy milk. The items listed above may not be a complete list of foods and beverages you can eat. Contact a dietitian for more options. Summary  Calcium and vitamin D are nutrients that play an important role in bone health and are an important part of your eating plan for osteoporosis.  Eat balanced meals that contain protein foods, fruits and vegetables, and foods rich in calcium and vitamin D.  Avoid foods that have more than 300 mg of sodium per serving. Too much sodium can cause your body to lose calcium.  Exercise is an important part of prevention and treatment of osteoporosis. Aim for at least 30 minutes a day, 5 days a week. This information is not intended to replace advice given to you by your health care provider. Make sure you discuss any questions you have with your health care provider. Document Revised: 07/04/2017 Document Reviewed: 07/04/2017 Elsevier Patient Education  2020 [employer].

## 2020-04-26 ENCOUNTER — Encounter: Payer: Self-pay | Admitting: Family Medicine

## 2020-05-01 DIAGNOSIS — Z20822 Contact with and (suspected) exposure to covid-19: Secondary | ICD-10-CM | POA: Diagnosis not present

## 2020-06-19 ENCOUNTER — Other Ambulatory Visit: Payer: Self-pay | Admitting: Physician Assistant

## 2020-06-23 ENCOUNTER — Telehealth: Payer: Self-pay

## 2020-06-23 NOTE — Chronic Care Management (AMB) (Signed)
° ° °  Chronic Care Management Pharmacy Assistant   Entered in error  Clarita Leber, [place] Pharmacist Assistant [phone]

## 2020-06-30 NOTE — Progress Notes (Signed)
    Chronic Care Management Pharmacy Assistant   Name: Karen Clay  MRN: [id number] DOB: 1955-10-10  Reason for Encounter: General adherence review  Patient Questions:  1.  Have you seen any other providers since your last visit? No  2.  Any changes in your medicines or health? No     PCP : Rochel Brome, MD  Allergies:   Allergies  Allergen Reactions  . Ace Inhibitors     cough  . Niaspan [Niacin]   . Zetia [Ezetimibe]     Medications: Outpatient Encounter Medications as of 06/23/2020  Medication Sig  . buPROPion (WELLBUTRIN XL) 300 MG 24 hr tablet Take 300 mg by mouth daily.   . cyanocobalamin (,VITAMIN B-12,) 1000 MCG/ML injection INJECT 1ML INTRAMUSCULARLY ONCE A MONTH  . FLUoxetine (PROZAC) 20 MG capsule Take 40 mg by mouth daily.   Marland Kitchen levothyroxine (SYNTHROID) 75 MCG tablet TAKE 1 TABLET BY MOUTH ONCE DAILY  . losartan-hydrochlorothiazide (HYZAAR) 50-12.5 MG tablet Take 1 tablet by mouth daily.  Marland Kitchen MISC NATURAL PRODUCTS PO Take 1 tablet by mouth daily. Blood builder supplement from health food store  . MISC NATURAL PRODUCTS PO Take 2 tablets by mouth daily. Biocell Collagen for joint/skin/hair  . potassium chloride SA (KLOR-CON) 20 MEQ tablet TAKE 1 TABLET BY MOUTH DAILY  . Probiotic Product (PHILLIPS COLON HEALTH PO) Take 1 capsule by mouth daily.  . rosuvastatin (CRESTOR) 5 MG tablet TAKE 1 TABLET BY MOUTH DAILY  . tiZANidine (ZANAFLEX) 2 MG tablet Take 1 tablet (2 mg total) by mouth 3 (three) times daily. (Patient not taking: Reported on 03/26/2020)  . triamcinolone ointment (KENALOG) 0.1 % Apply 1 application topically 2 (two) times daily. (Patient not taking: Reported on 03/26/2020)   No facility-administered encounter medications on file as of 06/23/2020.    Current Diagnosis: Patient Active Problem List   Diagnosis Date Noted  . Mixed hyperlipidemia 07/09/2019  . Dyslipidemia associated with type 2 diabetes mellitus ([place]) 07/09/2019  .  Essential hypertension, benign 07/09/2019  . Lumbar pain 07/09/2019  . BMI 31.0-31.9,adult 07/09/2019  . Status post total right knee replacement using cement 01/29/2016  . S/P left unicompartmental knee replacement 07/25/2014  . Localized osteoarthritis of knees, bilateral 02/14/2014   Spoke with patient and she stated she is doing pretty well.  She stated that for quite a while now she has been getting back pain and cramps in her glute muscles.  She doesn't like to take muscle relaxers because they make her sleepy, she had some 600mg  ibuprofen and said they seem to help.   Patient is not currently taking her blood pressure, but says she feels fine.  Patient states she stays active and is eating healthy, she is following a Keto diet.   She stated that her and Donette Larry, CPP discussed getting back to one blood pressure pill instead of 2, she was wondering the status.  She recently filled her current medication so she wont need any for a while.  Follow-Up:  Pharmacist Review  Donette Larry, CPP notified  Clarita Leber, [place] Pharmacist Assistant [phone]

## 2020-07-01 ENCOUNTER — Telehealth: Payer: Self-pay

## 2020-07-01 NOTE — Progress Notes (Signed)
    Chronic Care Management Pharmacy Assistant   Name: Karen Clay  MRN: [id number] DOB: 1955-12-05  Reason for Encounter: Medication Review for losartan-HCTZ   PCP : Rochel Brome, MD  Allergies:   Allergies  Allergen Reactions  . Ace Inhibitors     cough  . Niaspan [Niacin]   . Zetia [Ezetimibe]     Medications: Outpatient Encounter Medications as of 07/01/2020  Medication Sig  . buPROPion (WELLBUTRIN XL) 300 MG 24 hr tablet Take 300 mg by mouth daily.   . cyanocobalamin (,VITAMIN B-12,) 1000 MCG/ML injection INJECT 1ML INTRAMUSCULARLY ONCE A MONTH  . FLUoxetine (PROZAC) 20 MG capsule Take 40 mg by mouth daily.   Marland Kitchen levothyroxine (SYNTHROID) 75 MCG tablet TAKE 1 TABLET BY MOUTH ONCE DAILY  . losartan-hydrochlorothiazide (HYZAAR) 50-12.5 MG tablet Take 1 tablet by mouth daily.  Marland Kitchen MISC NATURAL PRODUCTS PO Take 1 tablet by mouth daily. Blood builder supplement from health food store  . MISC NATURAL PRODUCTS PO Take 2 tablets by mouth daily. Biocell Collagen for joint/skin/hair  . potassium chloride SA (KLOR-CON) 20 MEQ tablet TAKE 1 TABLET BY MOUTH DAILY  . Probiotic Product (PHILLIPS COLON HEALTH PO) Take 1 capsule by mouth daily.  . rosuvastatin (CRESTOR) 5 MG tablet TAKE 1 TABLET BY MOUTH DAILY  . tiZANidine (ZANAFLEX) 2 MG tablet Take 1 tablet (2 mg total) by mouth 3 (three) times daily. (Patient not taking: Reported on 03/26/2020)  . triamcinolone ointment (KENALOG) 0.1 % Apply 1 application topically 2 (two) times daily. (Patient not taking: Reported on 03/26/2020)   No facility-administered encounter medications on file as of 07/01/2020.    Current Diagnosis: Patient Active Problem List   Diagnosis Date Noted  . Mixed hyperlipidemia 07/09/2019  . Dyslipidemia associated with type 2 diabetes mellitus ([place]) 07/09/2019  . Essential hypertension, benign 07/09/2019  . Lumbar pain 07/09/2019  . BMI 31.0-31.9,adult 07/09/2019  . Status post total right knee  replacement using cement 01/29/2016  . S/P left unicompartmental knee replacement 07/25/2014  . Localized osteoarthritis of knees, bilateral 02/14/2014   Patient had a question about her blood pressure medication, she said that Dr. Tobie Poet and her had discussed a combination medication instead of two pills.  I sent Donette Larry, CPP a message, and asked if this was a possibility, Donette Larry, CPP stated that Dr. Tobie Poet had sent in a new script for a Losartan-HCTZ combination back in Nov. 2021.    I called the Patient to let her know.  She said she would finish up her current medications and then switch to the new one.      Follow-Up:  Pharmacist Review  Donette Larry, CPP notified  Karen Clay, [place] Pharmacist Assistant [phone]

## 2020-07-07 NOTE — Progress Notes (Signed)
Subjective:  Patient ID: Karen Clay, female    DOB: Jan 16, 1956  Age: 65 y.o. MRN: [id number]  Chief Complaint  Patient presents with  . Diabetes  . Hyperlipidemia    HPI Essential hypertension - Patient takes losartan-Hctz 50-12.5 mg tablet daily.  Mixed hyperlipidemia - Patient is currently taking rosuvastatin 5 mg daily.  Prediabetes - Patient is eating low carbs diet. Exercise.   Lumbar pain: Bl Buttock pain. Worsens with walking and even after water aerobics.   Complaining of numbness in hands. Worse at night or when driving cars.  Told few years ago had CTS. May have tried wrist braces a few years ago.     Current Outpatient Medications on File Prior to Visit  Medication Sig Dispense Refill  . buPROPion (WELLBUTRIN XL) 300 MG 24 hr tablet Take 300 mg by mouth daily.     . cyanocobalamin (,VITAMIN B-12,) 1000 MCG/ML injection INJECT 1ML INTRAMUSCULARLY ONCE A MONTH 3 mL 1  . FLUoxetine (PROZAC) 20 MG capsule Take 40 mg by mouth daily.     Marland Kitchen levothyroxine (SYNTHROID) 75 MCG tablet TAKE 1 TABLET BY MOUTH ONCE DAILY 90 tablet 3  . losartan-hydrochlorothiazide (HYZAAR) 50-12.5 MG tablet Take 1 tablet by mouth daily. 90 tablet 1  . MISC NATURAL PRODUCTS PO Take 1 tablet by mouth daily. Blood builder supplement from health food store    . MISC NATURAL PRODUCTS PO Take 2 tablets by mouth daily. Biocell Collagen for joint/skin/hair    . potassium chloride SA (KLOR-CON) 20 MEQ tablet TAKE 1 TABLET BY MOUTH DAILY 90 tablet 1  . Probiotic Product (PHILLIPS COLON HEALTH PO) Take 1 capsule by mouth daily.    . rosuvastatin (CRESTOR) 5 MG tablet TAKE 1 TABLET BY MOUTH DAILY 90 tablet 1  . triamcinolone ointment (KENALOG) 0.1 % Apply 1 application topically 2 (two) times daily. (Patient not taking: Reported on 03/26/2020)     No current facility-administered medications on file prior to visit.   Past Medical History:  Diagnosis Date  . Atrophy of thyroid   .  Benign carcinoid tumor of the stomach   . CTS (carpal tunnel syndrome)   . Essential hypertension   . Major depressive disorder   . Melena   . Mixed hyperlipidemia   . Nonalcoholic fatty liver disease   . Osteoarthritis   . Sleep apnea   . Vitamin B deficiency    Past Surgical History:  Procedure Laterality Date  . CESAREAN SECTION    . REPLACEMENT TOTAL KNEE Right   . unicompartmental knee replacement Left     Family History  Problem Relation Age of Onset  . Aneurysm Mother   . Heart failure Father   . CAD Father    Social History   Socioeconomic History  . Marital status: Married    Spouse name: Not on file  . Number of children: 3  . Years of education: Not on file  . Highest education level: Not on file  Occupational History  . Occupation: self employed  Tobacco Use  . Smoking status: Former Research scientist (life sciences)  . Smokeless tobacco: Never Used  Vaping Use  . Vaping Use: Never used  Substance and Sexual Activity  . Alcohol use: Yes    Alcohol/week: 14.0 standard drinks    Types: 14 Glasses of wine per week    Comment: averages 2 glasses a night  . Drug use: No  . Sexual activity: Not on file  Other Topics Concern  . Not on  file  Social History Narrative  . Not on file   Social Determinants of Health   Financial Resource Strain: Not on file  Food Insecurity: No Food Insecurity  . Worried About Charity fundraiser in the Last Year: Never true  . Ran Out of Food in the Last Year: Never true  Transportation Needs: No Transportation Needs  . Lack of Transportation (Medical): No  . Lack of Transportation (Non-Medical): No  Physical Activity: Insufficiently Active  . Days of Exercise per Week: 2 days  . Minutes of Exercise per Session: 60 min  Stress: Not on file  Social Connections: Not on file    Review of Systems  Constitutional: Negative for chills, fatigue and fever.  HENT: Negative for congestion, ear pain and sore throat.   Respiratory: Negative for cough  and shortness of breath.   Cardiovascular: Negative for chest pain and palpitations.  Gastrointestinal: Negative for abdominal pain, constipation, diarrhea, nausea and vomiting.  Endocrine: Negative for polydipsia, polyphagia and polyuria.  Genitourinary: Negative for difficulty urinating and dysuria.  Musculoskeletal: Negative for arthralgias, back pain and myalgias.  Skin: Negative for rash.  Neurological: Negative for headaches.  Psychiatric/Behavioral: Negative for dysphoric mood. The patient is not nervous/anxious.      Objective:  BP 120/64   Pulse 60   Temp (!) 97.2 F (36.2 C)   Ht 5\' 3"  (1.6 m)   Wt 176 lb (79.8 kg)   SpO2 100%   BMI 31.18 kg/m   BP/Weight 07/08/2020 01/09/2020 3/38/2505  Systolic BP 397 - 673  Diastolic BP 64 - 72  Wt. (Lbs) 176 164 174  BMI 31.18 29.05 30.82    Physical Exam Vitals reviewed.  Constitutional:      Appearance: Normal appearance.  Neck:     Vascular: No carotid bruit.  Cardiovascular:     Rate and Rhythm: Normal rate and regular rhythm.     Pulses: Normal pulses.     Heart sounds: Normal heart sounds.  Pulmonary:     Effort: Pulmonary effort is normal.     Breath sounds: Normal breath sounds.  Abdominal:     General: Bowel sounds are normal.     Palpations: Abdomen is soft.     Tenderness: There is no abdominal tenderness.  Neurological:     Mental Status: She is alert and oriented to person, place, and time.     Comments: Positive tinels and phalens BL.  FROM of hips. Tenderness over BL buttocks. Negative SI joint maneuvers.   Psychiatric:        Mood and Affect: Mood normal.        Behavior: Behavior normal.     Diabetic Foot Exam - Simple   Simple Foot Form Diabetic Foot exam was performed with the following findings: Yes 07/08/2020 10:36 AM  Visual Inspection No deformities, no ulcerations, no other skin breakdown bilaterally: Yes Sensation Testing Intact to touch and monofilament testing bilaterally: Yes Pulse  Check Posterior Tibialis and Dorsalis pulse intact bilaterally: Yes Comments      Lab Results  Component Value Date   WBC 5.1 07/08/2020   HGB 11.8 07/08/2020   HCT 35.1 07/08/2020   PLT 227 07/08/2020   GLUCOSE 104 (H) 07/08/2020   CHOL 177 07/08/2020   TRIG 78 07/08/2020   HDL 72 07/08/2020   LDLCALC 91 07/08/2020   ALT 19 07/08/2020   AST 18 07/08/2020   NA 139 07/08/2020   K 4.5 07/08/2020   CL 101 07/08/2020  CREATININE 0.79 07/08/2020   BUN 19 07/08/2020   CO2 21 07/08/2020   TSH 2.570 07/08/2020   HGBA1C 4.8 07/08/2020      Assessment & Plan:   1. Essential hypertension Well controlled.  No changes to medicines.  Continue to work on eating a healthy diet and exercise.  Labs drawn today.  - Comprehensive metabolic panel - CBC with Differential/Platelet  2. Mixed hyperlipidemia Well controlled.  No changes to medicines.  Continue to work on eating a healthy diet and exercise.  Labs drawn today.  - Lipid panel - TSH  3. Prediabetes Recommend continue to work on eating healthy diet and exercise. - Hemoglobin A1c - POCT UA - Microalbumin  4. Lumbar pain - meloxicam (MOBIC) 15 MG tablet; Take 1 tablet (15 mg total) by mouth daily.  Dispense: 30 tablet; Refill: 0  5. Carpal tunnel syndrome, bilateral upper limbs - meloxicam (MOBIC) 15 MG tablet; Take 1 tablet (15 mg total) by mouth daily.  Dispense: 30 tablet; Refill: 0 CTS braces BL.  6. Mild recurrent major depression ([place]) The current medical regimen is effective;  continue present plan and medications.   Meds ordered this encounter  Medications  . meloxicam (MOBIC) 15 MG tablet    Sig: Take 1 tablet (15 mg total) by mouth daily.    Dispense:  30 tablet    Refill:  0    Orders Placed This Encounter  Procedures  . Comprehensive metabolic panel  . Hemoglobin A1c  . Lipid panel  . TSH  . CBC with Differential/Platelet  . Cardiovascular Risk Assessment  . POCT UA - Microalbumin      Follow-up: Return in about 6 months (around 01/08/2021) for fasting.  An After Visit Summary was printed and given to the patient.  Rochel Brome, MD [facility] [phone]

## 2020-07-08 ENCOUNTER — Other Ambulatory Visit: Payer: Self-pay

## 2020-07-08 ENCOUNTER — Ambulatory Visit (INDEPENDENT_AMBULATORY_CARE_PROVIDER_SITE_OTHER): Payer: PPO | Admitting: Family Medicine

## 2020-07-08 ENCOUNTER — Encounter: Payer: Self-pay | Admitting: Family Medicine

## 2020-07-08 VITALS — BP 120/64 | HR 60 | Temp 97.2°F | Ht 63.0 in | Wt 176.0 lb

## 2020-07-08 DIAGNOSIS — F330 Major depressive disorder, recurrent, mild: Secondary | ICD-10-CM

## 2020-07-08 DIAGNOSIS — E1169 Type 2 diabetes mellitus with other specified complication: Secondary | ICD-10-CM

## 2020-07-08 DIAGNOSIS — I10 Essential (primary) hypertension: Secondary | ICD-10-CM | POA: Diagnosis not present

## 2020-07-08 DIAGNOSIS — R7303 Prediabetes: Secondary | ICD-10-CM

## 2020-07-08 DIAGNOSIS — G5603 Carpal tunnel syndrome, bilateral upper limbs: Secondary | ICD-10-CM

## 2020-07-08 DIAGNOSIS — E782 Mixed hyperlipidemia: Secondary | ICD-10-CM | POA: Diagnosis not present

## 2020-07-08 DIAGNOSIS — M545 Low back pain, unspecified: Secondary | ICD-10-CM

## 2020-07-08 MED ORDER — MELOXICAM 15 MG PO TABS: 15.0000 mg | ORAL_TABLET | Freq: Every day | ORAL | 0 refills | Status: DC

## 2020-07-08 NOTE — Patient Instructions (Signed)
Start on meloxicam 15 mg once daily. (take regularly for at least 2 weeks) If wrists or buttocks are not improved, call the office.   Carpal Tunnel Syndrome  Carpal tunnel syndrome is a condition that causes pain, weakness, and numbness in your hand and arm. Numbness is when you cannot feel an area in your body. The carpal tunnel is a narrow area that is on the palm side of your wrist. Repeated wrist motion or certain diseases may cause swelling in the tunnel. This swelling can pinch the main nerve in the wrist. This nerve is called the median nerve. What are the causes? This condition may be caused by:  Moving your hand and wrist over and over again while doing a task.  Injury to the wrist.  Arthritis.  A sac of fluid (cyst) or abnormal growth (tumor) in the carpal tunnel.  Fluid buildup during pregnancy.  Use of tools that vibrate. Sometimes the cause is not known. What increases the risk? The following factors may make you more likely to have this condition:  Having a job that makes you do these things: ? Move your hand over and over again. ? Work with tools that vibrate, such as drills or sanders.  Being a woman.  Having diabetes, obesity, thyroid problems, or kidney failure. What are the signs or symptoms? Symptoms of this condition include:  A tingling feeling in your fingers.  Tingling or loss of feeling in your hand.  Pain in your entire arm. This pain may get worse when you bend your wrist and elbow for a long time.  Pain in your wrist that goes up your arm to your shoulder.  Pain that goes down into your palm or fingers.  Weakness in your hands. You may find it hard to grab and hold items. You may feel worse at night. How is this treated? This condition may be treated with:  Lifestyle changes. You will be asked to stop or change the activity that caused your problem.  Doing exercises and activities that make bones, muscles, and tendons stronger (physical  therapy).  Learning how to use your hand again (occupational therapy).  Medicines for pain and swelling. You may have injections in your wrist.  A wrist splint or brace.  Surgery. Follow these instructions at home: If you have a splint or brace:  Wear the splint or brace as told by your doctor. Take it off only as told by your doctor.  Loosen the splint if your fingers: ? Tingle. ? Become numb. ? Turn cold and blue.  Keep the splint or brace clean.  If the splint or brace is not waterproof: ? Do not let it get wet. ? Cover it with a watertight covering when you take a bath or a shower. Managing pain, stiffness, and swelling If told, put ice on the painful area:  If you have a removable splint or brace, remove it as told by your doctor.  Put ice in a plastic bag.  Place a towel between your skin and the bag.  Leave the ice on for 20 minutes, 2-3 times per day. Do not fall asleep with the cold pack on your skin.  Take off the ice if your skin turns bright red. This is very important. If you cannot feel pain, heat, or cold, you have a greater risk of damage to the area. Move your fingers often to reduce stiffness and swelling.   General instructions  Take over-the-counter and prescription medicines only as told  by your doctor.  Rest your wrist from any activity that may cause pain. If needed, talk with your boss at work about changes that can help your wrist heal.  Do exercises as told by your doctor, physical therapist, or occupational therapist.  Keep all follow-up visits. Contact a doctor if:  You have new symptoms.  Medicine does not help your pain.  Your symptoms get worse. Get help right away if:  You have very bad numbness or tingling in your wrist or hand. Summary  Carpal tunnel syndrome is a condition that causes pain in your hand and arm.  It is often caused by repeated wrist motions.  Lifestyle changes and medicines are used to treat this problem.  Surgery may help in very bad cases.  Follow your doctor's instructions about wearing a splint, resting your wrist, keeping follow-up visits, and calling for help. This information is not intended to replace advice given to you by your health care provider. Make sure you discuss any questions you have with your health care provider. Document Revised: 09/06/2019 Document Reviewed: 09/06/2019 Elsevier Patient Education  [place].

## 2020-07-09 LAB — COMPREHENSIVE METABOLIC PANEL
ALT: 19 IU/L (ref 0–32)
AST: 18 IU/L (ref 0–40)
Albumin/Globulin Ratio: 2 (ref 1.2–2.2)
Albumin: 4.9 g/dL — ABNORMAL HIGH (ref 3.8–4.8)
Alkaline Phosphatase: 81 IU/L (ref 44–121)
BUN/Creatinine Ratio: 24 (ref 12–28)
BUN: 19 mg/dL (ref 8–27)
Bilirubin Total: 0.4 mg/dL (ref 0.0–1.2)
CO2: 21 mmol/L (ref 20–29)
Calcium: 9.4 mg/dL (ref 8.7–10.3)
Chloride: 101 mmol/L (ref 96–106)
Creatinine, Ser: 0.79 mg/dL (ref 0.57–1.00)
Globulin, Total: 2.5 g/dL (ref 1.5–4.5)
Glucose: 104 mg/dL — ABNORMAL HIGH (ref 65–99)
Potassium: 4.5 mmol/L (ref 3.5–5.2)
Sodium: 139 mmol/L (ref 134–144)
Total Protein: 7.4 g/dL (ref 6.0–8.5)
eGFR: 82 mL/min/{1.73_m2} (ref >59)

## 2020-07-09 LAB — LIPID PANEL
Chol/HDL Ratio: 2.5 ratio (ref 0.0–4.4)
Cholesterol, Total: 177 mg/dL (ref 100–199)
HDL: 72 mg/dL (ref >39)
LDL Chol Calc (NIH): 91 mg/dL (ref 0–99)
Triglycerides: 78 mg/dL (ref 0–149)
VLDL Cholesterol Cal: 14 mg/dL (ref 5–40)

## 2020-07-09 LAB — CBC WITH DIFFERENTIAL/PLATELET
Basophils Absolute: 0.1 10*3/uL (ref 0.0–0.2)
Basos: 2 %
EOS (ABSOLUTE): 0.5 10*3/uL — ABNORMAL HIGH (ref 0.0–0.4)
Eos: 10 %
Hematocrit: 35.1 % (ref 34.0–46.6)
Hemoglobin: 11.8 g/dL (ref 11.1–15.9)
Immature Grans (Abs): 0.1 10*3/uL (ref 0.0–0.1)
Immature Granulocytes: 1 %
Lymphocytes Absolute: 0.9 10*3/uL (ref 0.7–3.1)
Lymphs: 17 %
MCH: 32 pg (ref 26.6–33.0)
MCHC: 33.6 g/dL (ref 31.5–35.7)
MCV: 95 fL (ref 79–97)
Monocytes Absolute: 0.5 10*3/uL (ref 0.1–0.9)
Monocytes: 10 %
Neutrophils Absolute: 3 10*3/uL (ref 1.4–7.0)
Neutrophils: 60 %
Platelets: 227 10*3/uL (ref 150–450)
RBC: 3.69 x10E6/uL — ABNORMAL LOW (ref 3.77–5.28)
RDW: 12.1 % (ref 11.7–15.4)
WBC: 5.1 10*3/uL (ref 3.4–10.8)

## 2020-07-09 LAB — HEMOGLOBIN A1C
Est. average glucose Bld gHb Est-mCnc: 91 mg/dL
Hgb A1c MFr Bld: 4.8 % (ref 4.8–5.6)

## 2020-07-09 LAB — CARDIOVASCULAR RISK ASSESSMENT

## 2020-07-09 LAB — TSH: TSH: 2.57 u[IU]/mL (ref 0.450–4.500)

## 2020-07-24 DIAGNOSIS — K648 Other hemorrhoids: Secondary | ICD-10-CM | POA: Diagnosis not present

## 2020-07-24 DIAGNOSIS — D3A092 Benign carcinoid tumor of the stomach: Secondary | ICD-10-CM | POA: Diagnosis not present

## 2020-07-29 DIAGNOSIS — Z20822 Contact with and (suspected) exposure to covid-19: Secondary | ICD-10-CM | POA: Diagnosis not present

## 2020-08-05 DIAGNOSIS — E785 Hyperlipidemia, unspecified: Secondary | ICD-10-CM | POA: Diagnosis not present

## 2020-08-05 DIAGNOSIS — K31819 Angiodysplasia of stomach and duodenum without bleeding: Secondary | ICD-10-CM | POA: Diagnosis not present

## 2020-08-05 DIAGNOSIS — K31811 Angiodysplasia of stomach and duodenum with bleeding: Secondary | ICD-10-CM | POA: Diagnosis not present

## 2020-08-05 DIAGNOSIS — D3A092 Benign carcinoid tumor of the stomach: Secondary | ICD-10-CM | POA: Diagnosis not present

## 2020-08-05 DIAGNOSIS — F329 Major depressive disorder, single episode, unspecified: Secondary | ICD-10-CM | POA: Diagnosis not present

## 2020-08-05 DIAGNOSIS — I10 Essential (primary) hypertension: Secondary | ICD-10-CM | POA: Diagnosis not present

## 2020-08-05 DIAGNOSIS — K449 Diaphragmatic hernia without obstruction or gangrene: Secondary | ICD-10-CM | POA: Diagnosis not present

## 2020-08-05 DIAGNOSIS — Z79890 Hormone replacement therapy (postmenopausal): Secondary | ICD-10-CM | POA: Diagnosis not present

## 2020-08-05 DIAGNOSIS — Z85020 Personal history of malignant carcinoid tumor of stomach: Secondary | ICD-10-CM | POA: Diagnosis not present

## 2020-08-05 DIAGNOSIS — D500 Iron deficiency anemia secondary to blood loss (chronic): Secondary | ICD-10-CM | POA: Diagnosis not present

## 2020-08-20 DIAGNOSIS — Z01419 Encounter for gynecological examination (general) (routine) without abnormal findings: Secondary | ICD-10-CM | POA: Diagnosis not present

## 2020-08-20 DIAGNOSIS — Z1231 Encounter for screening mammogram for malignant neoplasm of breast: Secondary | ICD-10-CM | POA: Diagnosis not present

## 2020-08-20 LAB — HM MAMMOGRAPHY

## 2020-08-22 ENCOUNTER — Other Ambulatory Visit: Payer: Self-pay | Admitting: Family Medicine

## 2020-08-29 DIAGNOSIS — R922 Inconclusive mammogram: Secondary | ICD-10-CM | POA: Diagnosis not present

## 2020-09-09 ENCOUNTER — Telehealth: Payer: Self-pay

## 2020-09-09 NOTE — Progress Notes (Signed)
    Chronic Care Management Pharmacy Assistant   Name: Karen Clay  MRN: [id number] DOB: June 22, 1955  Reason for Encounter: Disease State for General adherence   Recent office visits:  07/08/20-Dr. Tobie Poet PCP, hypertension, start Meloxicam 15mg , patient not taking Tizanidine, follow up 33mos Labs look great!  05/01/20-Urgent care for Covid test- result was negative  03/26/20-Sara Brown CPP,-bone density ordered  Recent consult visits:  none  Hospital visits:  None in previous 6 months  Medications: Outpatient Encounter Medications as of 09/09/2020  Medication Sig  . buPROPion (WELLBUTRIN XL) 300 MG 24 hr tablet Take 300 mg by mouth daily.   . cyanocobalamin (,VITAMIN B-12,) 1000 MCG/ML injection INJECT 1ML INTRAMUSCULARLY ONCE A MONTH  . FLUoxetine (PROZAC) 20 MG capsule Take 40 mg by mouth daily.   Marland Kitchen levothyroxine (SYNTHROID) 75 MCG tablet TAKE 1 TABLET BY MOUTH ONCE DAILY  . losartan-hydrochlorothiazide (HYZAAR) 50-12.5 MG tablet Take 1 tablet by mouth daily.  . meloxicam (MOBIC) 15 MG tablet Take 1 tablet (15 mg total) by mouth daily.  Marland Kitchen MISC NATURAL PRODUCTS PO Take 1 tablet by mouth daily. Blood builder supplement from health food store  . MISC NATURAL PRODUCTS PO Take 2 tablets by mouth daily. Biocell Collagen for joint/skin/hair  . potassium chloride SA (KLOR-CON) 20 MEQ tablet TAKE 1 TABLET BY MOUTH DAILY  . Probiotic Product (PHILLIPS COLON HEALTH PO) Take 1 capsule by mouth daily.  . rosuvastatin (CRESTOR) 5 MG tablet TAKE 1 TABLET BY MOUTH DAILY  . triamcinolone ointment (KENALOG) 0.1 % Apply 1 application topically 2 (two) times daily. (Patient not taking: Reported on 03/26/2020)   No facility-administered encounter medications on file as of 09/09/2020.   Spoke to patient she is doing well.  I asked her about her hand pain/numbness, she stated it comes and goes, but does get better when she remembers to wear her hand braces.   Patient is taking her  medication as directed, she has not reported issues or side effects. She has no issues with obtaining her medications from pharmacy.   Patient does not check her blood pressures or blood sugars at home.   Patient watches her diet, but not always as close as she should,  and stays very active.   Patient has no current unmanaged issues or symptoms.   She will contact our office if problems or changes occur   Star Rating Drugs: Losartan 08/20/20 90ds Rosuvastatin 07/04/20 90ds   Care Gaps Mar 10 2020 OPHTHALMOLOGY EXAM (Yearly) Last completed: Mar 11, 2019    [facility] 23  2022 MAMMOGRAM (Yearly) Last completed: Jul 31, 2019   Aug 26 2020 TETANUS/TDAP (Every 10 Years) Last completed: Aug 27, 2010    Clarita Leber, [place] Pharmacist Assistant [phone]

## 2020-09-18 ENCOUNTER — Other Ambulatory Visit: Payer: Self-pay | Admitting: Family Medicine

## 2020-09-18 DIAGNOSIS — M5451 Vertebrogenic low back pain: Secondary | ICD-10-CM | POA: Diagnosis not present

## 2020-09-18 DIAGNOSIS — M9903 Segmental and somatic dysfunction of lumbar region: Secondary | ICD-10-CM | POA: Diagnosis not present

## 2020-09-18 DIAGNOSIS — M9902 Segmental and somatic dysfunction of thoracic region: Secondary | ICD-10-CM | POA: Diagnosis not present

## 2020-09-18 DIAGNOSIS — M461 Sacroiliitis, not elsewhere classified: Secondary | ICD-10-CM | POA: Diagnosis not present

## 2020-09-18 DIAGNOSIS — M9905 Segmental and somatic dysfunction of pelvic region: Secondary | ICD-10-CM | POA: Diagnosis not present

## 2020-09-19 DIAGNOSIS — M9905 Segmental and somatic dysfunction of pelvic region: Secondary | ICD-10-CM | POA: Diagnosis not present

## 2020-09-19 DIAGNOSIS — M9903 Segmental and somatic dysfunction of lumbar region: Secondary | ICD-10-CM | POA: Diagnosis not present

## 2020-09-19 DIAGNOSIS — M461 Sacroiliitis, not elsewhere classified: Secondary | ICD-10-CM | POA: Diagnosis not present

## 2020-09-19 DIAGNOSIS — M5451 Vertebrogenic low back pain: Secondary | ICD-10-CM | POA: Diagnosis not present

## 2020-09-19 DIAGNOSIS — M9902 Segmental and somatic dysfunction of thoracic region: Secondary | ICD-10-CM | POA: Diagnosis not present

## 2020-09-24 ENCOUNTER — Encounter: Payer: Self-pay | Admitting: Family Medicine

## 2020-09-24 DIAGNOSIS — M9905 Segmental and somatic dysfunction of pelvic region: Secondary | ICD-10-CM | POA: Diagnosis not present

## 2020-09-24 DIAGNOSIS — M9902 Segmental and somatic dysfunction of thoracic region: Secondary | ICD-10-CM | POA: Diagnosis not present

## 2020-09-24 DIAGNOSIS — M461 Sacroiliitis, not elsewhere classified: Secondary | ICD-10-CM | POA: Diagnosis not present

## 2020-09-24 DIAGNOSIS — M5451 Vertebrogenic low back pain: Secondary | ICD-10-CM | POA: Diagnosis not present

## 2020-09-24 DIAGNOSIS — M9903 Segmental and somatic dysfunction of lumbar region: Secondary | ICD-10-CM | POA: Diagnosis not present

## 2020-09-25 DIAGNOSIS — M461 Sacroiliitis, not elsewhere classified: Secondary | ICD-10-CM | POA: Diagnosis not present

## 2020-09-25 DIAGNOSIS — M9903 Segmental and somatic dysfunction of lumbar region: Secondary | ICD-10-CM | POA: Diagnosis not present

## 2020-09-25 DIAGNOSIS — M9902 Segmental and somatic dysfunction of thoracic region: Secondary | ICD-10-CM | POA: Diagnosis not present

## 2020-09-25 DIAGNOSIS — M9905 Segmental and somatic dysfunction of pelvic region: Secondary | ICD-10-CM | POA: Diagnosis not present

## 2020-09-25 DIAGNOSIS — M5451 Vertebrogenic low back pain: Secondary | ICD-10-CM | POA: Diagnosis not present

## 2020-09-30 ENCOUNTER — Other Ambulatory Visit: Payer: Self-pay | Admitting: Family Medicine

## 2020-10-01 DIAGNOSIS — M9902 Segmental and somatic dysfunction of thoracic region: Secondary | ICD-10-CM | POA: Diagnosis not present

## 2020-10-01 DIAGNOSIS — M9903 Segmental and somatic dysfunction of lumbar region: Secondary | ICD-10-CM | POA: Diagnosis not present

## 2020-10-01 DIAGNOSIS — M9905 Segmental and somatic dysfunction of pelvic region: Secondary | ICD-10-CM | POA: Diagnosis not present

## 2020-10-01 DIAGNOSIS — M5451 Vertebrogenic low back pain: Secondary | ICD-10-CM | POA: Diagnosis not present

## 2020-10-01 DIAGNOSIS — M461 Sacroiliitis, not elsewhere classified: Secondary | ICD-10-CM | POA: Diagnosis not present

## 2020-10-02 DIAGNOSIS — M9905 Segmental and somatic dysfunction of pelvic region: Secondary | ICD-10-CM | POA: Diagnosis not present

## 2020-10-02 DIAGNOSIS — M461 Sacroiliitis, not elsewhere classified: Secondary | ICD-10-CM | POA: Diagnosis not present

## 2020-10-02 DIAGNOSIS — M5451 Vertebrogenic low back pain: Secondary | ICD-10-CM | POA: Diagnosis not present

## 2020-10-02 DIAGNOSIS — M9902 Segmental and somatic dysfunction of thoracic region: Secondary | ICD-10-CM | POA: Diagnosis not present

## 2020-10-02 DIAGNOSIS — M9903 Segmental and somatic dysfunction of lumbar region: Secondary | ICD-10-CM | POA: Diagnosis not present

## 2020-10-07 DIAGNOSIS — M9902 Segmental and somatic dysfunction of thoracic region: Secondary | ICD-10-CM | POA: Diagnosis not present

## 2020-10-07 DIAGNOSIS — M461 Sacroiliitis, not elsewhere classified: Secondary | ICD-10-CM | POA: Diagnosis not present

## 2020-10-07 DIAGNOSIS — M5451 Vertebrogenic low back pain: Secondary | ICD-10-CM | POA: Diagnosis not present

## 2020-10-07 DIAGNOSIS — M9905 Segmental and somatic dysfunction of pelvic region: Secondary | ICD-10-CM | POA: Diagnosis not present

## 2020-10-07 DIAGNOSIS — M9903 Segmental and somatic dysfunction of lumbar region: Secondary | ICD-10-CM | POA: Diagnosis not present

## 2020-10-14 DIAGNOSIS — M461 Sacroiliitis, not elsewhere classified: Secondary | ICD-10-CM | POA: Diagnosis not present

## 2020-10-14 DIAGNOSIS — M9905 Segmental and somatic dysfunction of pelvic region: Secondary | ICD-10-CM | POA: Diagnosis not present

## 2020-10-14 DIAGNOSIS — M5451 Vertebrogenic low back pain: Secondary | ICD-10-CM | POA: Diagnosis not present

## 2020-10-14 DIAGNOSIS — M9903 Segmental and somatic dysfunction of lumbar region: Secondary | ICD-10-CM | POA: Diagnosis not present

## 2020-10-14 DIAGNOSIS — M9902 Segmental and somatic dysfunction of thoracic region: Secondary | ICD-10-CM | POA: Diagnosis not present

## 2020-10-21 DIAGNOSIS — M9905 Segmental and somatic dysfunction of pelvic region: Secondary | ICD-10-CM | POA: Diagnosis not present

## 2020-10-21 DIAGNOSIS — M9902 Segmental and somatic dysfunction of thoracic region: Secondary | ICD-10-CM | POA: Diagnosis not present

## 2020-10-21 DIAGNOSIS — M9903 Segmental and somatic dysfunction of lumbar region: Secondary | ICD-10-CM | POA: Diagnosis not present

## 2020-10-21 DIAGNOSIS — M5451 Vertebrogenic low back pain: Secondary | ICD-10-CM | POA: Diagnosis not present

## 2020-10-21 DIAGNOSIS — M461 Sacroiliitis, not elsewhere classified: Secondary | ICD-10-CM | POA: Diagnosis not present

## 2020-10-29 DIAGNOSIS — M461 Sacroiliitis, not elsewhere classified: Secondary | ICD-10-CM | POA: Diagnosis not present

## 2020-10-29 DIAGNOSIS — M9903 Segmental and somatic dysfunction of lumbar region: Secondary | ICD-10-CM | POA: Diagnosis not present

## 2020-10-29 DIAGNOSIS — D3A092 Benign carcinoid tumor of the stomach: Secondary | ICD-10-CM | POA: Diagnosis not present

## 2020-10-29 DIAGNOSIS — D500 Iron deficiency anemia secondary to blood loss (chronic): Secondary | ICD-10-CM | POA: Diagnosis not present

## 2020-10-29 DIAGNOSIS — D649 Anemia, unspecified: Secondary | ICD-10-CM | POA: Diagnosis not present

## 2020-10-29 DIAGNOSIS — M5451 Vertebrogenic low back pain: Secondary | ICD-10-CM | POA: Diagnosis not present

## 2020-10-29 DIAGNOSIS — M9902 Segmental and somatic dysfunction of thoracic region: Secondary | ICD-10-CM | POA: Diagnosis not present

## 2020-10-29 DIAGNOSIS — M9905 Segmental and somatic dysfunction of pelvic region: Secondary | ICD-10-CM | POA: Diagnosis not present

## 2020-11-04 DIAGNOSIS — D500 Iron deficiency anemia secondary to blood loss (chronic): Secondary | ICD-10-CM | POA: Diagnosis not present

## 2020-11-04 DIAGNOSIS — D3A092 Benign carcinoid tumor of the stomach: Secondary | ICD-10-CM | POA: Diagnosis not present

## 2020-11-04 DIAGNOSIS — Z800 Family history of malignant neoplasm of digestive organs: Secondary | ICD-10-CM | POA: Diagnosis not present

## 2020-11-04 DIAGNOSIS — K31819 Angiodysplasia of stomach and duodenum without bleeding: Secondary | ICD-10-CM | POA: Diagnosis not present

## 2020-11-04 DIAGNOSIS — E039 Hypothyroidism, unspecified: Secondary | ICD-10-CM | POA: Diagnosis not present

## 2020-11-04 DIAGNOSIS — F32A Depression, unspecified: Secondary | ICD-10-CM | POA: Diagnosis not present

## 2020-11-04 DIAGNOSIS — I10 Essential (primary) hypertension: Secondary | ICD-10-CM | POA: Diagnosis not present

## 2020-11-04 DIAGNOSIS — Z96652 Presence of left artificial knee joint: Secondary | ICD-10-CM | POA: Diagnosis not present

## 2020-11-04 DIAGNOSIS — D509 Iron deficiency anemia, unspecified: Secondary | ICD-10-CM | POA: Diagnosis not present

## 2020-11-11 DIAGNOSIS — M9902 Segmental and somatic dysfunction of thoracic region: Secondary | ICD-10-CM | POA: Diagnosis not present

## 2020-11-11 DIAGNOSIS — M461 Sacroiliitis, not elsewhere classified: Secondary | ICD-10-CM | POA: Diagnosis not present

## 2020-11-11 DIAGNOSIS — M9905 Segmental and somatic dysfunction of pelvic region: Secondary | ICD-10-CM | POA: Diagnosis not present

## 2020-11-11 DIAGNOSIS — M5451 Vertebrogenic low back pain: Secondary | ICD-10-CM | POA: Diagnosis not present

## 2020-11-11 DIAGNOSIS — M9903 Segmental and somatic dysfunction of lumbar region: Secondary | ICD-10-CM | POA: Diagnosis not present

## 2020-11-18 ENCOUNTER — Other Ambulatory Visit: Payer: Self-pay | Admitting: Physician Assistant

## 2020-12-02 DIAGNOSIS — M461 Sacroiliitis, not elsewhere classified: Secondary | ICD-10-CM | POA: Diagnosis not present

## 2020-12-02 DIAGNOSIS — M9902 Segmental and somatic dysfunction of thoracic region: Secondary | ICD-10-CM | POA: Diagnosis not present

## 2020-12-02 DIAGNOSIS — M5451 Vertebrogenic low back pain: Secondary | ICD-10-CM | POA: Diagnosis not present

## 2020-12-02 DIAGNOSIS — M9903 Segmental and somatic dysfunction of lumbar region: Secondary | ICD-10-CM | POA: Diagnosis not present

## 2020-12-02 DIAGNOSIS — M9905 Segmental and somatic dysfunction of pelvic region: Secondary | ICD-10-CM | POA: Diagnosis not present

## 2020-12-12 ENCOUNTER — Telehealth: Payer: Self-pay

## 2020-12-12 NOTE — Chronic Care Management (AMB) (Signed)
Chronic Care Management Pharmacy Assistant   Name: Karen Clay  MRN: [id number] DOB: 05-Oct-1955  Reason for Encounter: Hypertension Disease State Call  Recent office visits:  No visits noted  Recent consult visits:  No visits noted  Hospital visits:  None in previous 6 months  Medications: Outpatient Encounter Medications as of 12/12/2020  Medication Sig   buPROPion (WELLBUTRIN XL) 300 MG 24 hr tablet Take 300 mg by mouth daily.    cyanocobalamin (,VITAMIN B-12,) 1000 MCG/ML injection INJECT 1ML INTRAMUSCULARLY ONCE A MONTH   FLUoxetine (PROZAC) 20 MG capsule Take 40 mg by mouth daily.    levothyroxine (SYNTHROID) 75 MCG tablet TAKE 1 TABLET BY MOUTH ONCE DAILY   losartan-hydrochlorothiazide (HYZAAR) 50-12.5 MG tablet Take 1 tablet by mouth daily.   meloxicam (MOBIC) 15 MG tablet Take 1 tablet (15 mg total) by mouth daily.   MISC NATURAL PRODUCTS PO Take 1 tablet by mouth daily. Blood builder supplement from health food store   [place] Take 2 tablets by mouth daily. Biocell Collagen for joint/skin/hair   potassium chloride SA (KLOR-CON) 20 MEQ tablet TAKE 1 TABLET BY MOUTH DAILY   Probiotic Product (PHILLIPS COLON HEALTH PO) Take 1 capsule by mouth daily.   rosuvastatin (CRESTOR) 5 MG tablet TAKE 1 TABLET BY MOUTH DAILY   triamcinolone ointment (KENALOG) 0.1 % Apply 1 application topically 2 (two) times daily. (Patient not taking: Reported on 03/26/2020)   No facility-administered encounter medications on file as of 12/12/2020.     Recent Office Vitals: BP Readings from Last 3 Encounters:  07/08/20 120/64  10/23/19 124/72  07/09/19 126/70   Pulse Readings from Last 3 Encounters:  07/08/20 60  10/23/19 97  07/09/19 87    Wt Readings from Last 3 Encounters:  07/08/20 176 lb (79.8 kg)  01/09/20 164 lb (74.4 kg)  10/23/19 174 lb (78.9 kg)     Kidney Function Lab Results  Component Value Date/Time   CREATININE 0.79 07/08/2020 10:37  AM   CREATININE 0.64 07/09/2019 10:30 AM   GFRNONAA 93 07/09/2019 10:30 AM   GFRAA 108 07/09/2019 10:30 AM    BMP Latest Ref Rng & Units 07/08/2020 07/09/2019  Glucose 65 - 99 mg/dL 104(H) 101(H)  BUN 8 - 27 mg/dL 19 23  Creatinine 0.57 - 1.00 mg/dL 0.79 0.64  BUN/Creat Ratio 12 - 28 24 36(H)  Sodium 134 - 144 mmol/L 139 140  Potassium 3.5 - 5.2 mmol/L 4.5 5.1  Chloride 96 - 106 mmol/L 101 101  CO2 20 - 29 mmol/L 21 -  Calcium 8.7 - 10.3 mg/dL 9.4 10.2   Several unsuccessful attempts made to contact patient   Current antihypertensive regimen:  losartan '50mg'$  tablet daily.  Patient verbally confirms she is taking the above medications as directed.   How often are you checking your Blood Pressure?   Current home BP readings:   DATE:             BP               PULSE     Wrist or arm cuff: Caffeine intake: Salt intake: OTC medications including pseudoephedrine or NSAIDs?  Any readings above 180/120?  If yes any symptoms of hypertensive emergency?    What recent interventions/DTPs have been made by any provider to improve Blood Pressure control since last CPP Visit:   Any recent hospitalizations or ED visits since last visit with CPP?   What diet changes have been  made to improve Blood Pressure Control?   What exercise is being done to improve your Blood Pressure Control?    Adherence Review: Is the patient currently on ACE/ARB medication?  Does the patient have >5 day gap between last estimated fill dates? CPP to review  Care Gaps: Last annual wellness visit? 10/23/19  Star Rating Drugs:  Medication:   Last Fill: Day Supply losartan '50mg'$    11/18/20 90 DS Rosuvastatin 5 mg  09/30/20 90 DS   Wil[place] Sports CPA, CMA

## 2020-12-30 ENCOUNTER — Other Ambulatory Visit: Payer: Self-pay | Admitting: Family Medicine

## 2020-12-30 DIAGNOSIS — M5451 Vertebrogenic low back pain: Secondary | ICD-10-CM | POA: Diagnosis not present

## 2020-12-30 DIAGNOSIS — M9902 Segmental and somatic dysfunction of thoracic region: Secondary | ICD-10-CM | POA: Diagnosis not present

## 2020-12-30 DIAGNOSIS — M9903 Segmental and somatic dysfunction of lumbar region: Secondary | ICD-10-CM | POA: Diagnosis not present

## 2020-12-30 DIAGNOSIS — M461 Sacroiliitis, not elsewhere classified: Secondary | ICD-10-CM | POA: Diagnosis not present

## 2020-12-30 DIAGNOSIS — M9905 Segmental and somatic dysfunction of pelvic region: Secondary | ICD-10-CM | POA: Diagnosis not present

## 2021-01-07 NOTE — Progress Notes (Signed)
Subjective:  Patient ID: Karen Clay, female    DOB: Jun 17, 1955  Age: 65 y.o. MRN: [id number]  Chief Complaint  Patient presents with   Hyperlipidemia   Hypertension   HPI Hyperlipidemia: On crestor 5 mg daily. Exercising and has quit drinking wine. Also eating healthy. Pt has lost 26 lbs since last visit in 07/08/2020. Walks.  HTN- Hyzaar 50-12.5 mg daily TSH- synthroid 75 mcg once daily Depression- prozac 20 mg daily and wellbutrin xl 300 mg once daily in am. Pt has been on medicines for years, but when tried to stop she became more depressed.   PHQ9 SCORE ONLY 01/08/2021 03/26/2020 10/23/2019  PHQ-9 Total Score 0 0 0     Current Outpatient Medications on File Prior to Visit  Medication Sig Dispense Refill   buPROPion (WELLBUTRIN XL) 300 MG 24 hr tablet Take 300 mg by mouth daily.      cyanocobalamin (,VITAMIN B-12,) 1000 MCG/ML injection INJECT 1 ML INTRAMUSCULARLY ONCE A MONTH 3 mL 0   FLUoxetine (PROZAC) 20 MG capsule Take 40 mg by mouth daily.      levothyroxine (SYNTHROID) 75 MCG tablet TAKE 1 TABLET BY MOUTH ONCE DAILY 90 tablet 3   losartan-hydrochlorothiazide (HYZAAR) 50-12.5 MG tablet Take 1 tablet by mouth daily. 90 tablet 1   meloxicam (MOBIC) 15 MG tablet Take 1 tablet (15 mg total) by mouth daily. 30 tablet 0   MISC NATURAL PRODUCTS PO Take 1 tablet by mouth daily. Blood builder supplement from health food store     [place] Take 2 tablets by mouth daily. Biocell Collagen for joint/skin/hair     potassium chloride SA (KLOR-CON) 20 MEQ tablet TAKE 1 TABLET BY MOUTH DAILY 90 tablet 0   Probiotic Product (PHILLIPS COLON HEALTH PO) Take 1 capsule by mouth daily.     rosuvastatin (CRESTOR) 5 MG tablet TAKE 1 TABLET BY MOUTH DAILY 90 tablet 0   triamcinolone ointment (KENALOG) 0.1 % Apply 1 application topically 2 (two) times daily. (Patient not taking: Reported on 03/26/2020)     No current facility-administered medications on file prior to  visit.   Past Medical History:  Diagnosis Date   Atrophy of thyroid    Benign carcinoid tumor of the stomach    CTS (carpal tunnel syndrome)    Essential hypertension    Major depressive disorder    Melena    Mixed hyperlipidemia    Nonalcoholic fatty liver disease    Osteoarthritis    Sleep apnea    Vitamin B deficiency    Past Surgical History:  Procedure Laterality Date   CESAREAN SECTION     REPLACEMENT TOTAL KNEE Right    unicompartmental knee replacement Left     Family History  Problem Relation Age of Onset   Aneurysm Mother    Heart failure Father    CAD Father    Social History   Socioeconomic History   Marital status: Married    Spouse name: Not on file   Number of children: 3   Years of education: Not on file   Highest education level: Not on file  Occupational History   Occupation: self employed  Tobacco Use   Smoking status: Former   Smokeless tobacco: Never  Scientific laboratory technician Use: Never used  Substance and Sexual Activity   Alcohol use: Yes    Alcohol/week: 14.0 standard drinks    Types: 14 Glasses of wine per week    Comment: averages 2  glasses a night   Drug use: No   Sexual activity: Not on file  Other Topics Concern   Not on file  Social History Narrative   Not on file   Social Determinants of Health   Financial Resource Strain: Not on file  Food Insecurity: No Food Insecurity   Worried About Running Out of Food in the Last Year: Never true   Ran Out of Food in the Last Year: Never true  Transportation Needs: No Transportation Needs   Lack of Transportation (Medical): No   Lack of Transportation (Non-Medical): No  Physical Activity: Insufficiently Active   Days of Exercise per Week: 2 days   Minutes of Exercise per Session: 60 min  Stress: Not on file  Social Connections: Not on file    Review of Systems  Constitutional:  Negative for chills, fatigue and fever.  HENT:  Negative for congestion, ear pain, rhinorrhea and sore  throat.   Respiratory:  Negative for cough and shortness of breath.   Cardiovascular:  Negative for chest pain.  Gastrointestinal:  Negative for abdominal pain, constipation, diarrhea, nausea and vomiting.  Genitourinary:  Negative for dysuria and urgency.  Musculoskeletal:  Negative for back pain and myalgias.  Neurological:  Negative for dizziness, weakness, light-headedness and headaches.  Psychiatric/Behavioral:  Negative for dysphoric mood. The patient is not nervous/anxious.     Objective:  BP (!) 110/58   Pulse (!) 51   Temp 97.6 F (36.4 C)   Ht '5\' 3"'$  (1.6 m)   Wt 152 lb (68.9 kg)   SpO2 99%   BMI 26.93 kg/m   BP/Weight 01/08/2021 [id number]  Systolic BP A999333 [id number] -  Diastolic BP 58 64 -  Wt. (Lbs) 152 176 164  BMI 26.93 31.18 29.05    Physical Exam Vitals reviewed.  Constitutional:      Appearance: Normal appearance. She is normal weight.  Neck:     Vascular: No carotid bruit.  Cardiovascular:     Rate and Rhythm: Normal rate and regular rhythm.     Pulses: Normal pulses.     Heart sounds: Normal heart sounds.  Pulmonary:     Effort: Pulmonary effort is normal. No respiratory distress.     Breath sounds: Normal breath sounds.  Abdominal:     General: Abdomen is flat. Bowel sounds are normal.     Palpations: Abdomen is soft.     Tenderness: There is no abdominal tenderness.  Musculoskeletal:        General: Tenderness (left lumbar. negative SLR BL.) present.  Neurological:     Mental Status: She is alert and oriented to person, place, and time.  Psychiatric:        Mood and Affect: Mood normal.        Behavior: Behavior normal.    Diabetic Foot Exam - Simple   No data filed      Lab Results  Component Value Date   WBC 5.1 07/08/2020   HGB 11.8 07/08/2020   HCT 35.1 07/08/2020   PLT 227 07/08/2020   GLUCOSE 104 (H) 07/08/2020   CHOL 177 07/08/2020   TRIG 78 07/08/2020   HDL 72 07/08/2020   LDLCALC 91 07/08/2020   ALT 19 07/08/2020   AST  18 07/08/2020   NA 139 07/08/2020   K 4.5 07/08/2020   CL 101 07/08/2020   CREATININE 0.79 07/08/2020   BUN 19 07/08/2020   CO2 21 07/08/2020   TSH 2.570 07/08/2020   HGBA1C 4.8 07/08/2020  Assessment & Plan:   1. Essential hypertension Well controlled.  No changes to medicines.  Continue to work on eating a healthy diet and exercise.  Labs drawn today.   - Comprehensive metabolic panel  2. Mixed hyperlipidemia Well controlled.  No changes to medicines.  Continue to work on eating a healthy diet and exercise.  Labs drawn today.  - Lipid panel  3. Prediabetes Recommend continue to work on eating healthy diet and exercise.  - CBC with Differential/Platelet  4. Mild recurrent major depression (HCC) The current medical regimen is effective;  continue present plan and medications.  5. Need for immunization against influenza - Flu Vaccine QUAD High Dose(Fluad)   6. Left lumbar back pain.  Start on robaxin 500 mg one three times a day prn muscle spasms.  Meds ordered this encounter  Medications   methocarbamol (ROBAXIN) 500 MG tablet    Sig: Take 1 tablet (500 mg total) by mouth every 8 (eight) hours as needed for muscle spasms (back pain).    Dispense:  90 tablet    Refill:  0   Orders Placed This Encounter  Procedures   Flu Vaccine QUAD High Dose(Fluad)   CBC with Differential/Platelet   Comprehensive metabolic panel   Lipid panel    Follow-up: Return in about 6 months (around 07/08/2021) for fasting.  An After Visit Summary was printed and given to the patient.  Rochel Brome, MD [facility] [phone]

## 2021-01-08 ENCOUNTER — Ambulatory Visit (INDEPENDENT_AMBULATORY_CARE_PROVIDER_SITE_OTHER): Payer: PPO | Admitting: Family Medicine

## 2021-01-08 ENCOUNTER — Other Ambulatory Visit: Payer: Self-pay

## 2021-01-08 ENCOUNTER — Encounter: Payer: Self-pay | Admitting: Family Medicine

## 2021-01-08 VITALS — BP 110/58 | HR 51 | Temp 97.6°F | Ht 63.0 in | Wt 152.0 lb

## 2021-01-08 DIAGNOSIS — M545 Low back pain, unspecified: Secondary | ICD-10-CM

## 2021-01-08 DIAGNOSIS — Z23 Encounter for immunization: Secondary | ICD-10-CM

## 2021-01-08 DIAGNOSIS — R7303 Prediabetes: Secondary | ICD-10-CM

## 2021-01-08 DIAGNOSIS — F330 Major depressive disorder, recurrent, mild: Secondary | ICD-10-CM

## 2021-01-08 DIAGNOSIS — E782 Mixed hyperlipidemia: Secondary | ICD-10-CM | POA: Diagnosis not present

## 2021-01-08 DIAGNOSIS — I10 Essential (primary) hypertension: Secondary | ICD-10-CM

## 2021-01-08 MED ORDER — METHOCARBAMOL 500 MG PO TABS: 500.0000 mg | ORAL_TABLET | Freq: Three times a day (TID) | ORAL | 0 refills | Status: DC | PRN

## 2021-01-09 LAB — CBC WITH DIFFERENTIAL/PLATELET
Basophils Absolute: 0.1 10*3/uL (ref 0.0–0.2)
Basos: 1 %
EOS (ABSOLUTE): 0.4 10*3/uL (ref 0.0–0.4)
Eos: 6 %
Hematocrit: 37.4 % (ref 34.0–46.6)
Hemoglobin: 12.3 g/dL (ref 11.1–15.9)
Immature Grans (Abs): 0 10*3/uL (ref 0.0–0.1)
Immature Granulocytes: 1 %
Lymphocytes Absolute: 1.1 10*3/uL (ref 0.7–3.1)
Lymphs: 17 %
MCH: 28.7 pg (ref 26.6–33.0)
MCHC: 32.9 g/dL (ref 31.5–35.7)
MCV: 87 fL (ref 79–97)
Monocytes Absolute: 0.5 10*3/uL (ref 0.1–0.9)
Monocytes: 8 %
Neutrophils Absolute: 4.5 10*3/uL (ref 1.4–7.0)
Neutrophils: 67 %
Platelets: 257 10*3/uL (ref 150–450)
RBC: 4.28 x10E6/uL (ref 3.77–5.28)
RDW: 12.8 % (ref 11.7–15.4)
WBC: 6.6 10*3/uL (ref 3.4–10.8)

## 2021-01-09 LAB — LIPID PANEL
Chol/HDL Ratio: 3.1 ratio (ref 0.0–4.4)
Cholesterol, Total: 171 mg/dL (ref 100–199)
HDL: 55 mg/dL (ref >39)
LDL Chol Calc (NIH): 105 mg/dL — ABNORMAL HIGH (ref 0–99)
Triglycerides: 53 mg/dL (ref 0–149)
VLDL Cholesterol Cal: 11 mg/dL (ref 5–40)

## 2021-01-09 LAB — COMPREHENSIVE METABOLIC PANEL
ALT: 11 IU/L (ref 0–32)
AST: 15 IU/L (ref 0–40)
Albumin/Globulin Ratio: 1.8 (ref 1.2–2.2)
Albumin: 4.6 g/dL (ref 3.8–4.8)
Alkaline Phosphatase: 93 IU/L (ref 44–121)
BUN/Creatinine Ratio: 19 (ref 12–28)
BUN: 14 mg/dL (ref 8–27)
Bilirubin Total: 0.5 mg/dL (ref 0.0–1.2)
CO2: 22 mmol/L (ref 20–29)
Calcium: 9.4 mg/dL (ref 8.7–10.3)
Chloride: 102 mmol/L (ref 96–106)
Creatinine, Ser: 0.73 mg/dL (ref 0.57–1.00)
Globulin, Total: 2.5 g/dL (ref 1.5–4.5)
Glucose: 96 mg/dL (ref 65–99)
Potassium: 4.2 mmol/L (ref 3.5–5.2)
Sodium: 140 mmol/L (ref 134–144)
Total Protein: 7.1 g/dL (ref 6.0–8.5)
eGFR: 90 mL/min/{1.73_m2} (ref >59)

## 2021-01-09 LAB — CARDIOVASCULAR RISK ASSESSMENT

## 2021-02-04 ENCOUNTER — Other Ambulatory Visit: Payer: Self-pay

## 2021-02-04 ENCOUNTER — Ambulatory Visit (INDEPENDENT_AMBULATORY_CARE_PROVIDER_SITE_OTHER): Payer: PPO

## 2021-02-04 VITALS — BP 118/60 | HR 56 | Resp 16 | Ht 63.0 in | Wt 153.6 lb

## 2021-02-04 DIAGNOSIS — I83891 Varicose veins of right lower extremities with other complications: Secondary | ICD-10-CM

## 2021-02-04 DIAGNOSIS — Z0000 Encounter for general adult medical examination without abnormal findings: Secondary | ICD-10-CM

## 2021-02-04 DIAGNOSIS — Z9189 Other specified personal risk factors, not elsewhere classified: Secondary | ICD-10-CM

## 2021-02-04 DIAGNOSIS — N959 Unspecified menopausal and perimenopausal disorder: Secondary | ICD-10-CM | POA: Diagnosis not present

## 2021-02-04 NOTE — Progress Notes (Signed)
Subjective:   Karen Clay is a 65 y.o. female who presents for Medicare Annual (Subsequent) preventive examination.  This wellness visit is conducted by a nurse.  The patient's medications were reviewed and reconciled since the patient's last visit.  History details were provided by the patient.  The history appears to be reliable.    Patient's last AWV was one year ago.   Medical History: Patient history and Family history was reviewed  Medications, Allergies, and preventative health maintenance was reviewed and updated.   Review of Systems    ROS-Negative Cardiac Risk Factors include: advanced age (>78men, >91 women);dyslipidemia     Objective:    Today's Vitals   02/04/21 1100  BP: 118/60  Pulse: (!) 56  Resp: 16  SpO2: 97%  Weight: 153 lb 9.6 oz (69.7 kg)  Height: 5\' 3"  (1.6 m)  PainSc: 0-No pain   Body mass index is 27.21 kg/m.  Advanced Directives 02/04/2021 10/23/2019 07/09/2019  Does Patient Have a Medical Advance Directive? Yes Yes Yes  Type of Paramedic of [place];Living will Living will;Healthcare Power of ;Living will  Does patient want to make changes to medical advance directive? No - Patient declined No - Patient declined -  Copy of [place] in Chart? No - copy requested No - copy requested -    Current Medications (verified) Outpatient Encounter Medications as of 02/04/2021  Medication Sig   buPROPion (WELLBUTRIN XL) 300 MG 24 hr tablet Take 300 mg by mouth daily.    cyanocobalamin (,VITAMIN B-12,) 1000 MCG/ML injection INJECT 1 ML INTRAMUSCULARLY ONCE A MONTH   FLUoxetine (PROZAC) 20 MG capsule Take 40 mg by mouth daily.    levothyroxine (SYNTHROID) 75 MCG tablet TAKE 1 TABLET BY MOUTH ONCE DAILY   losartan-hydrochlorothiazide (HYZAAR) 50-12.5 MG tablet Take 1 tablet by mouth daily.   meloxicam (MOBIC) 15 MG tablet Take 1 tablet (15 mg total) by mouth daily.    methocarbamol (ROBAXIN) 500 MG tablet Take 1 tablet (500 mg total) by mouth every 8 (eight) hours as needed for muscle spasms (back pain).   MISC NATURAL PRODUCTS PO Take 1 tablet by mouth daily. Blood builder supplement from health food store   [place] Take 2 tablets by mouth daily. Biocell Collagen for joint/skin/hair   potassium chloride SA (KLOR-CON) 20 MEQ tablet TAKE 1 TABLET BY MOUTH DAILY   Probiotic Product (PHILLIPS COLON HEALTH PO) Take 1 capsule by mouth daily.   rosuvastatin (CRESTOR) 5 MG tablet TAKE 1 TABLET BY MOUTH DAILY   triamcinolone ointment (KENALOG) 0.1 % Apply 1 application topically 2 (two) times daily.   No facility-administered encounter medications on file as of 02/04/2021.    Allergies (verified) Ace inhibitors, Niaspan [niacin], and Zetia [ezetimibe]   History: Past Medical History:  Diagnosis Date   Atrophy of thyroid    Benign carcinoid tumor of the stomach    CTS (carpal tunnel syndrome)    Essential hypertension    Major depressive disorder    Melena    Mixed hyperlipidemia    Nonalcoholic fatty liver disease    Osteoarthritis    Sleep apnea    Vitamin B deficiency    Past Surgical History:  Procedure Laterality Date   CESAREAN SECTION     REPLACEMENT TOTAL KNEE Right    unicompartmental knee replacement Left    Family History  Problem Relation Age of Onset   Aneurysm Mother  Heart failure Father    CAD Father    Social History   Socioeconomic History   Marital status: Married    Spouse name: [name]   Number of children: 3  Occupational History   Occupation: Armed forces training and education officer, Research officer, political party class  Tobacco Use   Smoking status: Former   Smokeless tobacco: Never  Scientific laboratory technician Use: Never used  Substance and Sexual Activity   Alcohol use: Not Currently    Comment: occasional glass of wine   Drug use: No   Sexual activity: Not on file   Social Determinants of Health   Financial Resource Strain: Not  on file  Food Insecurity: No Food Insecurity   Worried About Charity fundraiser in the Last Year: Never true   Arboriculturist in the Last Year: Never true  Transportation Needs: No Transportation Needs   Lack of Transportation (Medical): No   Lack of Transportation (Non-Medical): No  Physical Activity: Sufficiently Active   Days of Exercise per Week: 7 days   Minutes of Exercise per Session: 60 min  Stress: Not on file  Social Connections: Not on file    Tobacco Counseling Counseling given: No tobacco use currently   Clinical Intake:  Pre-visit preparation completed: Yes Pain : No/denies pain Pain Score: 0-No pain   BMI - recorded: 27.21 Nutritional Status: BMI 25 -29 Overweight Nutritional Risks: None Diabetes: No How often do you need to have someone help you when you read instructions, pamphlets, or other written materials from your doctor or pharmacy?: 1 - Never Interpreter Needed?: No   Activities of Daily Living In your present state of health, do you have any difficulty performing the following activities: 02/04/2021 01/08/2021  Hearing? N N  Vision? N N  Difficulty concentrating or making decisions? N N  Walking or climbing stairs? N N  Dressing or bathing? N N  Doing errands, shopping? N N  Preparing Food and eating ? N -  Using the Toilet? N -  In the past six months, have you accidently leaked urine? N -  Do you have problems with loss of bowel control? N -  Managing your Medications? N -  Managing your Finances? N -  Housekeeping or managing your Housekeeping? N -  Some recent data might be hidden    Patient Care Team: Rochel Brome, MD as PCP - General (Family Medicine) Misenheimer, Christia Reading, MD as Consulting Physician (Unknown Physician Specialty) Burnice Logan, [facility] (Inactive) as Pharmacist (Pharmacist)     Assessment:   This is a routine wellness examination for [place].  Dietary issues and exercise activities discussed: Current Exercise  Habits: Home exercise routine;Structured exercise class, Type of exercise: walking;Other - see comments (water aerobics), Time (Minutes): 60, Frequency (Times/Week): 7, Weekly Exercise (Minutes/Week): 420, Intensity: Moderate, Exercise limited by: None identified  Depression Screen PHQ 2/9 Scores 02/04/2021 01/08/2021 03/26/2020 10/23/2019 07/09/2019  PHQ - 2 Score 0 0 0 0 0  PHQ- 9 Score 0 0 - - -    Fall Risk Fall Risk  02/04/2021 01/08/2021 07/08/2020 10/23/2019 07/09/2019  Falls in the past year? 0 0 0 0 0  Number falls in past yr: 0 0 0 0 0  Injury with Fall? 0 0 0 0 0  Risk for fall due to : No Fall Risks No Fall Risks No Fall Risks - -  Follow up Falls evaluation completed Falls evaluation completed Falls evaluation completed Falls evaluation completed -    FALL RISK  PREVENTION PERTAINING TO THE HOME:  Any stairs in or around the home? Yes  If so, are there any without handrails? No  Home free of loose throw rugs in walkways, pet beds, electrical cords, etc? Yes  Adequate lighting in your home to reduce risk of falls? Yes   ASSISTIVE DEVICES UTILIZED TO PREVENT FALLS:  Use of a cane, walker or w/c? No  Grab bars in the bathroom? Yes  Shower chair or bench in shower? No  Elevated toilet seat or a handicapped toilet? No   Gait steady and fast without use of assistive device  Cognitive Function:     6CIT Screen 02/04/2021 10/24/2019  What Year? 0 points 0 points  What month? 0 points 0 points  What time? 0 points 0 points  Count back from 20 0 points 0 points  Months in reverse 0 points 0 points  Repeat phrase 0 points 0 points  Total Score 0 0    Immunizations Immunization History  Administered Date(s) Administered   Fluad Quad(high Dose 65+) 02/19/2020, 01/08/2021   Influenza-Unspecified 01/08/2019   PFIZER(Purple Top)SARS-COV-2 Vaccination 05/27/2019, 06/16/2019, 02/19/2020   Pneumococcal Conjugate-13 08/21/2014   Pneumococcal Polysaccharide-23 03/23/2012   Td 08/27/2010    Tdap 08/27/2010   Zoster Recombinat (Shingrix) 02/08/2018    TDAP status: Due, Education has been provided regarding the importance of this vaccine. Advised may receive this vaccine at local pharmacy or Health Dept. Aware to provide a copy of the vaccination record if obtained from local pharmacy or Health Dept. Verbalized acceptance and understanding.  Flu Vaccine status: Up to date  Pneumococcal vaccine status: Up to date  Covid-19 vaccine status: Completed vaccines  Qualifies for Shingles Vaccine? Yes   Zostavax completed No   Shingrix Completed?: No.    Education has been provided regarding the importance of this vaccine. Patient has been advised to call insurance company to determine out of pocket expense if they have not yet received this vaccine. Advised may also receive vaccine at local pharmacy or Health Dept. Verbalized acceptance and understanding.  Screening Tests Health Maintenance  Topic Date Due   Hepatitis C Screening  Never done   Zoster Vaccines- Shingrix (2 of 2) 04/05/2018   DEXA SCAN  Never done   OPHTHALMOLOGY EXAM  03/10/2020   TETANUS/TDAP  08/26/2020   HEMOGLOBIN A1C  01/08/2021   COVID-19 Vaccine (4 - Booster for Pfizer series) 01/24/2022 (Originally 05/13/2020)   FOOT EXAM  07/08/2021   MAMMOGRAM  08/20/2021   COLONOSCOPY (Pts 45-77yrs Insurance coverage will need to be confirmed)  06/24/2027   INFLUENZA VACCINE  Completed   HPV VACCINES  Aged Out    Health Maintenance  Health Maintenance Due  Topic Date Due   Hepatitis C Screening  Never done   Zoster Vaccines- Shingrix (2 of 2) 04/05/2018   DEXA SCAN  Never done   OPHTHALMOLOGY EXAM  03/10/2020   TETANUS/TDAP  08/26/2020   HEMOGLOBIN A1C  01/08/2021    Colorectal cancer screening: Type of screening: Colonoscopy. Completed 06/2017. Repeat every 10 years  Mammogram status: Completed 08/2020. Repeat every year  Bone Density status: Ordered to be completed at [facility]  Screening: (Low Dose CT Chest recommended if Age 12-80 years, 30 pack-year currently smoking OR have quit w/in 15years.) does not qualify.   Additional Screening:  Vision Screening: Recommended annual ophthalmology exams for early detection of glaucoma and other disorders of the eye. Is the patient up to date with their annual eye  exam?  Yes  Who is the provider or what is the name of the office in which the patient attends annual eye exams? [place] Screening: Recommended annual dental exams for proper oral hygiene   Plan:    1- COVID Booster completed at today's visit 2- DEXA scan ordered - to [facility] at 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  She is requesting a referral to a local vein clinic for evaluation. 5- Continue healthy diet as well as frequent exercise.  I have personally reviewed and noted the following in the patient's chart:   Medical and social history Use of alcohol, tobacco or illicit drugs  Current medications and supplements including opioid prescriptions.  Functional ability and status Nutritional status Physical activity Advanced directives List of other physicians Hospitalizations, surgeries, and ER visits in previous 12 months Vitals Screenings to include cognitive, depression, and falls Referrals and appointments  In addition, I have reviewed and discussed with patient certain preventive protocols, quality metrics, and best practice recommendations. A written personalized care plan for preventive services as well as general preventive health recommendations were provided to patient.     Erie Noe, LPN   9/[id number]

## 2021-02-04 NOTE — Patient Instructions (Signed)
Health Maintenance, Female Adopting a healthy lifestyle and getting preventive care are important in promoting health and wellness. Ask your health care provider about: The right schedule for you to have regular tests and exams. Things you can do on your own to prevent diseases and keep yourself healthy. What should I know about diet, weight, and exercise? Eat a healthy diet  Eat a diet that includes plenty of vegetables, fruits, low-fat dairy products, and lean protein. Do not eat a lot of foods that are high in solid fats, added sugars, or sodium. Maintain a healthy weight Body mass index (BMI) is used to identify weight problems. It estimates body fat based on height and weight. Your health care provider can help determine your BMI and help you achieve or maintain a healthy weight. Get regular exercise Get regular exercise. This is one of the most important things you can do for your health. Most adults should: Exercise for at least 150 minutes each week. The exercise should increase your heart rate and make you sweat (moderate-intensity exercise). Do strengthening exercises at least twice a week. This is in addition to the moderate-intensity exercise. Spend less time sitting. Even light physical activity can be beneficial. Watch cholesterol and blood lipids Have your blood tested for lipids and cholesterol at 67 years of age, then have this test every 5 years. Have your cholesterol levels checked more often if: Your lipid or cholesterol levels are high. You are older than 67 years of age. You are at high risk for heart disease. What should I know about cancer screening? Depending on your health history and family history, you may need to have cancer screening at various ages. This may include screening for: Breast cancer. Cervical cancer. Colorectal cancer. Skin cancer. Lung cancer. What should I know about heart disease, diabetes, and high blood pressure? Blood pressure and heart  disease High blood pressure causes heart disease and increases the risk of stroke. This is more likely to develop in people who have high blood pressure readings, are of African descent, or are overweight. Have your blood pressure checked: Every 3-5 years if you are 18-39 years of age. Every year if you are 40 years old or older. Diabetes Have regular diabetes screenings. This checks your fasting blood sugar level. Have the screening done: Once every three years after age 40 if you are at a normal weight and have a low risk for diabetes. More often and at a younger age if you are overweight or have a high risk for diabetes. What should I know about preventing infection? Hepatitis B If you have a higher risk for hepatitis B, you should be screened for this virus. Talk with your health care provider to find out if you are at risk for hepatitis B infection. Hepatitis C Testing is recommended for: Everyone born from 1945 through 1965. Anyone with known risk factors for hepatitis C. Sexually transmitted infections (STIs) Get screened for STIs, including gonorrhea and chlamydia, if: You are sexually active and are younger than 67 years of age. You are older than 67 years of age and your health care provider tells you that you are at risk for this type of infection. Your sexual activity has changed since you were last screened, and you are at increased risk for chlamydia or gonorrhea. Ask your health care provider if you are at risk. Ask your health care provider about whether you are at high risk for HIV. Your health care provider may recommend a prescription medicine   to help prevent HIV infection. If you choose to take medicine to prevent HIV, you should first get tested for HIV. You should then be tested every 3 months for as long as you are taking the medicine. Pregnancy If you are about to stop having your period (premenopausal) and you may become pregnant, seek counseling before you get  pregnant. Take 400 to 800 micrograms (mcg) of folic acid every day if you become pregnant. Ask for birth control (contraception) if you want to prevent pregnancy. Osteoporosis and menopause Osteoporosis is a disease in which the bones lose minerals and strength with aging. This can result in bone fractures. If you are 65 years old or older, or if you are at risk for osteoporosis and fractures, ask your health care provider if you should: Be screened for bone loss. Take a calcium or vitamin D supplement to lower your risk of fractures. Be given hormone replacement therapy (HRT) to treat symptoms of menopause. Follow these instructions at home: Lifestyle Do not use any products that contain nicotine or tobacco, such as cigarettes, e-cigarettes, and chewing tobacco. If you need help quitting, ask your health care provider. Do not use street drugs. Do not share needles. Ask your health care provider for help if you need support or information about quitting drugs. Alcohol use Do not drink alcohol if: Your health care provider tells you not to drink. You are pregnant, may be pregnant, or are planning to become pregnant. If you drink alcohol: Limit how much you use to 0-1 drink a day. Limit intake if you are breastfeeding. Be aware of how much alcohol is in your drink. In the [place] one drink equals one 12 oz bottle of beer (355 mL), one 5 oz glass of wine (148 mL), or one 1 oz glass of hard liquor (44 mL). General instructions Schedule regular health, dental, and eye exams. Stay current with your vaccines. Tell your health care provider if: You often feel depressed. You have ever been abused or do not feel safe at home. Summary Adopting a healthy lifestyle and getting preventive care are important in promoting health and wellness. Follow your health care provider's instructions about healthy diet, exercising, and getting tested or screened for diseases. Follow your health care provider's  instructions on monitoring your cholesterol and blood pressure. This information is not intended to replace advice given to you by your health care provider. Make sure you discuss any questions you have with your health care provider. Document Revised: 07/04/2020 Document Reviewed: 04/19/2018 Elsevier Patient Education  2022 [employer]  

## 2021-02-17 ENCOUNTER — Other Ambulatory Visit: Payer: Self-pay | Admitting: Physician Assistant

## 2021-02-18 ENCOUNTER — Telehealth: Payer: Self-pay | Admitting: Family Medicine

## 2021-02-18 NOTE — Telephone Encounter (Signed)
PT IS SCHEDULED FOR BONE ENSITY TEST AT [facility] ON 04/24/21 CHECKING IN AT 2:00PM

## 2021-03-05 DIAGNOSIS — F331 Major depressive disorder, recurrent, moderate: Secondary | ICD-10-CM | POA: Diagnosis not present

## 2021-03-05 DIAGNOSIS — F411 Generalized anxiety disorder: Secondary | ICD-10-CM | POA: Diagnosis not present

## 2021-03-05 DIAGNOSIS — N943 Premenstrual tension syndrome: Secondary | ICD-10-CM | POA: Diagnosis not present

## 2021-03-10 ENCOUNTER — Other Ambulatory Visit: Payer: Self-pay

## 2021-03-10 DIAGNOSIS — I83891 Varicose veins of right lower extremities with other complications: Secondary | ICD-10-CM

## 2021-03-16 NOTE — Progress Notes (Signed)
[facility] [place]  ASSESSMENT / PLAN: Karen Clay is a 65 y.o. female with chronic venous insufficiency of right lower extremity causing prominent reticular veins which have had several episodes of bleeding.  We will submit her case to her insurance company to approve sclerotherapy of this area.  Sclerotherapy nurse will reach out to her to review insurance findings.  CHIEF COMPLAINT: Spontaneous bleeding from prominent vein on ankle  HISTORY OF PRESENT ILLNESS: Karen Clay is a 65 y.o. female who presents to clinic for evaluation of spontaneous bleeding from the ankle.  The patient reports she will have spontaneous bleeding from an area of reticular vein clusters on the anterior aspect of her ankle.  This occurs when she is showering.  The bleeding has stopped with time and pressure.  It is quite dramatic when it occurs.  She has no other complaints about her right lower extremity.  She has a remote history of endovenous ablation of the greater saphenous vein.  Past Medical History:  Diagnosis Date   Atrophy of thyroid    Benign carcinoid tumor of the stomach    CTS (carpal tunnel syndrome)    Essential hypertension    Major depressive disorder    Melena    Mixed hyperlipidemia    Nonalcoholic fatty liver disease    Osteoarthritis    Sleep apnea    Vitamin B deficiency     Past Surgical History:  Procedure Laterality Date   CESAREAN SECTION     REPLACEMENT TOTAL KNEE Right    unicompartmental knee replacement Left     Family History  Problem Relation Age of Onset   Aneurysm Mother    Heart failure Father    CAD Father     Social History   Socioeconomic History   Marital status: Married    Spouse name: [name]   Number of children: 3   Years of education: Not on file   Highest education level: Not on file  Occupational History   Occupation: Self Employeed, pottery painting class  Tobacco Use   Smoking  status: Former   Smokeless tobacco: Never  Scientific laboratory technician Use: Never used  Substance and Sexual Activity   Alcohol use: Not Currently    Comment: occasional glass of wine   Drug use: No   Sexual activity: Not on file  Other Topics Concern   Not on file  Social History Narrative   Not on file   Social Determinants of Health   Financial Resource Strain: Not on file  Food Insecurity: No Food Insecurity   Worried About Charity fundraiser in the Last Year: Never true   Ran Out of Food in the Last Year: Never true  Transportation Needs: No Transportation Needs   Lack of Transportation (Medical): No   Lack of Transportation (Non-Medical): No  Physical Activity: Sufficiently Active   Days of Exercise per Week: 7 days   Minutes of Exercise per Session: 60 min  Stress: Not on file  Social Connections: Not on file  Intimate Partner Violence: Not At Risk   Fear of Current or Ex-Partner: No   Emotionally Abused: No   Physically Abused: No   Sexually Abused: No    Allergies  Allergen Reactions   Ace Inhibitors     cough   Niaspan [Niacin]    Zetia [Ezetimibe]     Current Outpatient Medications  Medication Sig Dispense Refill   buPROPion (WELLBUTRIN XL) 300  MG 24 hr tablet Take 300 mg by mouth daily.      cyanocobalamin (,VITAMIN B-12,) 1000 MCG/ML injection INJECT 1 ML INTRAMUSCULARLY ONCE A MONTH 3 mL 0   FLUoxetine (PROZAC) 20 MG capsule Take 40 mg by mouth daily.      hydrochlorothiazide (MICROZIDE) 12.5 MG capsule TAKE 1 CAPSULE BY MOUTH DAILY, TAKE WITH LOSARTAN 90 capsule 1   levothyroxine (SYNTHROID) 75 MCG tablet TAKE 1 TABLET BY MOUTH ONCE DAILY 90 tablet 3   losartan (COZAAR) 50 MG tablet TAKE 1 TABLET BY MOUTH DAILY, TAKE WITH HCTZ 90 tablet 1   MISC NATURAL PRODUCTS PO Take 1 tablet by mouth daily. Blood builder supplement from health food store     [place] Take 2 tablets by mouth daily. Biocell Collagen for joint/skin/hair     potassium  chloride SA (KLOR-CON) 20 MEQ tablet TAKE 1 TABLET BY MOUTH DAILY 90 tablet 1   Probiotic Product (PHILLIPS COLON HEALTH PO) Take 1 capsule by mouth daily.     rosuvastatin (CRESTOR) 5 MG tablet TAKE 1 TABLET BY MOUTH DAILY 90 tablet 0   triamcinolone ointment (KENALOG) 0.1 % Apply 1 application topically 2 (two) times daily.     No current facility-administered medications for this visit.    REVIEW OF SYSTEMS:  [X]  denotes positive finding, [ ]  denotes negative finding Cardiac  Comments:  Chest pain or chest pressure:    Shortness of breath upon exertion:    Short of breath when lying flat:    Irregular heart rhythm:        Vascular    Pain in calf, thigh, or hip brought on by ambulation:    Pain in feet at night that wakes you up from your sleep:     Blood clot in your veins:    Leg swelling:         Pulmonary    Oxygen at home:    Productive cough:     Wheezing:         Neurologic    Sudden weakness in arms or legs:     Sudden numbness in arms or legs:     Sudden onset of difficulty speaking or slurred speech:    Temporary loss of vision in one eye:     Problems with dizziness:         Gastrointestinal    Blood in stool:     Vomited blood:         Genitourinary    Burning when urinating:     Blood in urine:        Psychiatric    Major depression:         Hematologic    Bleeding problems:    Problems with blood clotting too easily:        Skin    Rashes or ulcers:        Constitutional    Fever or chills:      PHYSICAL EXAM Vitals:   03/17/21 1556  BP: 120/67  Pulse: (!) 54  Resp: 20  Temp: 98.1 F (36.7 C)  SpO2: 97%  Weight: 152 lb (68.9 kg)  Height: 5\' 3"  (1.6 m)    Constitutional: well appearing. no distress. Appears well nourished.  Neurologic: CN intact. no focal findings. no sensory loss. Psychiatric:  Mood and affect symmetric and appropriate. Eyes:  No icterus. No conjunctival pallor. Ears, nose, throat:  mucous membranes moist.  Midline trachea.  Cardiac: regular rate and rhythm.  Respiratory:  unlabored. Abdominal:  soft, non-tender, non-distended.  Peripheral vascular: 2+ DP pulses. Prominent raised reticular veins about the right ankle.  Extremity: no edema. no cyanosis. no pallor.  Skin: no gangrene. no ulceration.  Lymphatic: no Stemmer's sign. no palpable lymphadenopathy.  PERTINENT LABORATORY AND RADIOLOGIC DATA  Most recent CBC CBC Latest Ref Rng & Units 01/08/2021 07/08/2020 07/09/2019  WBC 3.4 - 10.8 x10E3/uL 6.6 5.1 6.6  Hemoglobin 11.1 - 15.9 g/dL 12.3 11.8 13.4  Hematocrit 34.0 - 46.6 % 37.4 35.1 40.1  Platelets 150 - 450 x10E3/uL 257 227 206     Most recent CMP CMP Latest Ref Rng & Units 01/08/2021 07/08/2020 07/09/2019  Glucose 65 - 99 mg/dL 96 104(H) 101(H)  BUN 8 - 27 mg/dL 14 19 23   Creatinine 0.57 - 1.00 mg/dL 0.73 0.79 0.64  Sodium 134 - 144 mmol/L 140 139 140  Potassium 3.5 - 5.2 mmol/L 4.2 4.5 5.1  Chloride 96 - 106 mmol/L 102 101 101  CO2 20 - 29 mmol/L 22 21 -  Calcium 8.7 - 10.3 mg/dL 9.4 9.4 10.2  Total Protein 6.0 - 8.5 g/dL 7.1 7.4 7.6  Total Bilirubin 0.0 - 1.2 mg/dL 0.5 0.4 0.3  Alkaline Phos 44 - 121 IU/L 93 81 80  AST 0 - 40 IU/L 15 18 31   ALT 0 - 32 IU/L 11 19 -    Renal function CrCl cannot be calculated (Patient's most recent lab result is older than the maximum 21 days allowed.).  Hgb A1c MFr Bld (%)  Date Value  07/08/2020 4.8    LDL Chol Calc (NIH)  Date Value Ref Range Status  01/08/2021 105 (H) 0 - 99 mg/dL Final     Right extremity venous reflux study:  - No evidence of deep vein thrombosis seen in the right lower extremity,  from the common femoral through the popliteal veins.  - No evidence of superficial venous reflux seen in the right short  saphenous vein.  - Venous reflux is noted in the right common femoral vein.  - Venous reflux is noted in the right sapheno-femoral junction.  - Venous reflux is noted in the right greater saphenous vein in the  thigh.  - Venous reflux is noted in the right popliteal vein.     Yevonne Aline. Stanford Breed, MD [facility] Phone Number: [phone] 03/17/2021 5:39 PM  Total time spent on preparing this encounter including chart review, data review, collecting history, examining the patient, coordinating care for this new patient, 45 minutes.  Portions of this report may have been transcribed using voice recognition software.  Every effort has been made to ensure accuracy; however, inadvertent computerized transcription errors may still be present.

## 2021-03-17 ENCOUNTER — Ambulatory Visit: Payer: PPO | Admitting: Vascular Surgery

## 2021-03-17 ENCOUNTER — Ambulatory Visit (HOSPITAL_COMMUNITY)
Admission: RE | Admit: 2021-03-17 | Discharge: 2021-03-17 | Disposition: A | Discharge status: Home / Self Care | Payer: PPO | Source: Ambulatory Visit | Attending: Vascular Surgery | Admitting: Vascular Surgery

## 2021-03-17 ENCOUNTER — Encounter: Payer: Self-pay | Admitting: Vascular Surgery

## 2021-03-17 ENCOUNTER — Other Ambulatory Visit: Payer: Self-pay

## 2021-03-17 VITALS — BP 120/67 | HR 54 | Temp 98.1°F | Resp 20 | Ht 63.0 in | Wt 152.0 lb

## 2021-03-17 DIAGNOSIS — I83891 Varicose veins of right lower extremities with other complications: Secondary | ICD-10-CM | POA: Insufficient documentation

## 2021-03-17 DIAGNOSIS — I872 Venous insufficiency (chronic) (peripheral): Secondary | ICD-10-CM | POA: Diagnosis not present

## 2021-03-24 ENCOUNTER — Telehealth: Payer: Self-pay

## 2021-03-24 NOTE — Chronic Care Management (AMB) (Signed)
I called pt to remind them of their telephone visit tomorrow and pt did not have any concerns and did not want to follow up at this time unless the CPP needs to go over anything. Pt requested to  cancel appt.  Karen Clay, [place] Pharmacist Assistant  [phone]

## 2021-03-25 ENCOUNTER — Telehealth: Payer: PPO

## 2021-03-30 ENCOUNTER — Other Ambulatory Visit: Payer: Self-pay | Admitting: Family Medicine

## 2021-04-01 ENCOUNTER — Other Ambulatory Visit: Payer: Self-pay | Admitting: Family Medicine

## 2021-04-01 MED ORDER — TRIAMCINOLONE ACETONIDE 0.1 % EX OINT: 1.0000 "application " | TOPICAL_OINTMENT | Freq: Two times a day (BID) | CUTANEOUS | 1 refills | Status: AC

## 2021-05-07 NOTE — Telephone Encounter (Signed)
PT NO SHOWED BONE DENSITY

## 2021-05-12 ENCOUNTER — Other Ambulatory Visit: Payer: Self-pay | Admitting: Family Medicine

## 2021-05-15 ENCOUNTER — Other Ambulatory Visit: Payer: Self-pay

## 2021-05-15 ENCOUNTER — Ambulatory Visit: Payer: PPO

## 2021-05-15 DIAGNOSIS — I83893 Varicose veins of bilateral lower extremities with other complications: Secondary | ICD-10-CM | POA: Diagnosis not present

## 2021-05-15 NOTE — Progress Notes (Signed)
Treated pt's small reticular and spider veins on RLE with Asclera 1% administered with a 27g butterfly.  Patient received a total of 4 mL. Pt has had previous bleeding episodes on outer RLE area. Very easy access; anticipate good results. Pt was given post treatment care instructions on handout and verbally. Will follow prn.   Photos: Yes.    Compression stockings applied: Yes.

## 2021-06-09 DIAGNOSIS — G5703 Lesion of sciatic nerve, bilateral lower limbs: Secondary | ICD-10-CM | POA: Diagnosis not present

## 2021-06-09 DIAGNOSIS — M160 Bilateral primary osteoarthritis of hip: Secondary | ICD-10-CM | POA: Diagnosis not present

## 2021-06-09 DIAGNOSIS — M25559 Pain in unspecified hip: Secondary | ICD-10-CM | POA: Diagnosis not present

## 2021-06-16 ENCOUNTER — Other Ambulatory Visit: Payer: Self-pay | Admitting: Family Medicine

## 2021-06-16 NOTE — Telephone Encounter (Signed)
Refill sent to pharmacy.   

## 2021-06-29 ENCOUNTER — Other Ambulatory Visit: Payer: Self-pay | Admitting: Family Medicine

## 2021-07-09 ENCOUNTER — Ambulatory Visit (INDEPENDENT_AMBULATORY_CARE_PROVIDER_SITE_OTHER): Payer: PPO

## 2021-07-09 ENCOUNTER — Other Ambulatory Visit: Payer: Self-pay

## 2021-07-09 ENCOUNTER — Ambulatory Visit (INDEPENDENT_AMBULATORY_CARE_PROVIDER_SITE_OTHER): Payer: PPO | Admitting: Family Medicine

## 2021-07-09 VITALS — BP 116/64 | HR 60 | Temp 97.0°F | Resp 16 | Ht 63.0 in | Wt 149.0 lb

## 2021-07-09 DIAGNOSIS — E782 Mixed hyperlipidemia: Secondary | ICD-10-CM | POA: Diagnosis not present

## 2021-07-09 DIAGNOSIS — E538 Deficiency of other specified B group vitamins: Secondary | ICD-10-CM

## 2021-07-09 DIAGNOSIS — G5703 Lesion of sciatic nerve, bilateral lower limbs: Secondary | ICD-10-CM | POA: Diagnosis not present

## 2021-07-09 DIAGNOSIS — Z6826 Body mass index (BMI) 26.0-26.9, adult: Secondary | ICD-10-CM | POA: Diagnosis not present

## 2021-07-09 DIAGNOSIS — R7303 Prediabetes: Secondary | ICD-10-CM

## 2021-07-09 DIAGNOSIS — I10 Essential (primary) hypertension: Secondary | ICD-10-CM | POA: Diagnosis not present

## 2021-07-09 DIAGNOSIS — Z23 Encounter for immunization: Secondary | ICD-10-CM | POA: Diagnosis not present

## 2021-07-09 MED ORDER — MELOXICAM 15 MG PO TABS: 15.0000 mg | ORAL_TABLET | Freq: Every day | ORAL | 2 refills | Status: DC

## 2021-07-09 NOTE — Progress Notes (Signed)
? ?Subjective:  ?Patient ID: Karen Clay, female    DOB: 08/25/1955  Age: 66 y.o. MRN: [id number] ? ?Chief Complaint  ?Patient presents with  ? Hypertension  ? ? ?HPI ?Hypertension:  Hydrochlorthiazide 12.5 mg daily,  Losartan 50 mg daily. ?Hypothyroidism:  Levothyroxine 75 mcg daily.   ?Depression:  Bupropion 300 mg daily.  Doing well.  ?PHQ9 SCORE ONLY 07/09/2021 02/04/2021 01/08/2021  ?PHQ-9 Total Score 0 0 0  ? ?Hyperlipidemia:  Crestor 5 mg daily.   ?Eating healthy and exercising.  ?Complaining of buttock pain BL. Saw Dr. Ruel Favors PA. Recommend aleve twice daily for 2 weeks with stretches. This helped, but then came right back.  ? ?Current Outpatient Medications on File Prior to Visit  ?Medication Sig Dispense Refill  ? buPROPion (WELLBUTRIN XL) 300 MG 24 hr tablet Take 300 mg by mouth daily.     ? cyanocobalamin (,VITAMIN B-12,) 1000 MCG/ML injection INJECT 1 ML INTRAMUSCULARLY ONCE A MONTH 3 mL 0  ? FLUoxetine (PROZAC) 20 MG capsule Take 40 mg by mouth daily.     ? hydrochlorothiazide (MICROZIDE) 12.5 MG capsule TAKE 1 CAPSULE BY MOUTH DAILY, TAKE WITH LOSARTAN 90 capsule 1  ? levothyroxine (SYNTHROID) 75 MCG tablet TAKE 1 TABLET BY MOUTH ONCE DAILY 90 tablet 2  ? losartan (COZAAR) 50 MG tablet TAKE 1 TABLET BY MOUTH DAILY, TAKE WITH HCTZ 90 tablet 1  ? MISC NATURAL PRODUCTS PO Take 1 tablet by mouth daily. Blood builder supplement from health food store    ? MISC NATURAL PRODUCTS PO Take 2 tablets by mouth daily. Biocell Collagen for joint/skin/hair    ? potassium chloride SA (KLOR-CON) 20 MEQ tablet TAKE 1 TABLET BY MOUTH DAILY 90 tablet 1  ? Probiotic Product (PHILLIPS COLON HEALTH PO) Take 1 capsule by mouth daily.    ? rosuvastatin (CRESTOR) 5 MG tablet TAKE 1 TABLET BY MOUTH DAILY 90 tablet 0  ? triamcinolone ointment (KENALOG) 0.1 % Apply 1 application topically 2 (two) times daily. 80 g 1  ? ?No current facility-administered medications on file prior to visit.  ? ?Past Medical History:   ?Diagnosis Date  ? Atrophy of thyroid   ? Benign carcinoid tumor of the stomach   ? CTS (carpal tunnel syndrome)   ? Essential hypertension   ? Localized osteoarthritis of knees, bilateral 02/14/2014  ? Lumbar pain 07/09/2019  ? Major depressive disorder   ? Melena   ? Mixed hyperlipidemia   ? Nonalcoholic fatty liver disease   ? Osteoarthritis   ? S/P left unicompartmental knee replacement 07/25/2014  ? Sleep apnea   ? Status post total right knee replacement using cement 01/29/2016  ? Vitamin B deficiency   ? ?Past Surgical History:  ?Procedure Laterality Date  ? CESAREAN SECTION    ? REPLACEMENT TOTAL KNEE Right   ? unicompartmental knee replacement Left   ?  ?Family History  ?Problem Relation Age of Onset  ? Aneurysm Mother   ? Heart failure Father   ? CAD Father   ? ?Social History  ? ?Socioeconomic History  ? Marital status: Married  ?  Spouse name: Karen Clay  ? Number of children: 3  ? Years of education: Not on file  ? Highest education level: Not on file  ?Occupational History  ? Occupation: Self Employeed, pottery painting class  ?Tobacco Use  ? Smoking status: Former  ? Smokeless tobacco: Never  ?Vaping Use  ? Vaping Use: Never used  ?Substance and Sexual Activity  ?  Alcohol use: Not Currently  ?  Comment: occasional glass of wine  ? Drug use: No  ? Sexual activity: Not on file  ?Other Topics Concern  ? Not on file  ?Social History Narrative  ? Not on file  ? ?Social Determinants of Health  ? ?Financial Resource Strain: Not on file  ?Food Insecurity: No Food Insecurity  ? Worried About Charity fundraiser in the Last Year: Never true  ? Ran Out of Food in the Last Year: Never true  ?Transportation Needs: No Transportation Needs  ? Lack of Transportation (Medical): No  ? Lack of Transportation (Non-Medical): No  ?Physical Activity: Sufficiently Active  ? Days of Exercise per Week: 7 days  ? Minutes of Exercise per Session: 60 min  ?Stress: Not on file  ?Social Connections: Not on file  ? ? ?Review of Systems   ?Constitutional:  Negative for chills, fatigue and fever.  ?HENT:  Negative for congestion, rhinorrhea and sore throat.   ?Respiratory:  Negative for cough and shortness of breath.   ?Cardiovascular:  Negative for chest pain.  ?Gastrointestinal:  Negative for abdominal pain, constipation, diarrhea, nausea and vomiting.  ?Genitourinary:  Negative for dysuria and urgency.  ?Musculoskeletal:  Positive for myalgias. Negative for back pain.  ?Neurological:  Negative for dizziness, weakness, light-headedness and headaches.  ?Psychiatric/Behavioral:  Negative for dysphoric mood. The patient is not nervous/anxious.   ? ? ?Objective:  ?BP 116/64   Pulse 60   Temp (!) 97 ?F (36.1 ?C)   Resp 16   Ht 5\' 3"  (1.6 m)   Wt 149 lb (67.6 kg)   BMI 26.39 kg/m?  ? ?BP/Weight 07/09/2021 03/17/2021 02/04/2021  ?Systolic BP 818 299 371  ?Diastolic BP 64 67 60  ?Wt. (Lbs) 149 152 153.6  ?BMI 26.39 26.93 27.21  ? ? ?Physical Exam ?Vitals reviewed.  ?Constitutional:   ?   Appearance: Normal appearance. She is normal weight.  ?Neck:  ?   Vascular: No carotid bruit.  ?Cardiovascular:  ?   Rate and Rhythm: Normal rate and regular rhythm.  ?   Heart sounds: Normal heart sounds.  ?Pulmonary:  ?   Effort: Pulmonary effort is normal. No respiratory distress.  ?   Breath sounds: Normal breath sounds.  ?Abdominal:  ?   General: Abdomen is flat. Bowel sounds are normal.  ?   Palpations: Abdomen is soft.  ?   Tenderness: There is no abdominal tenderness.  ?Musculoskeletal:     ?   General: Tenderness (over BL buttocks. c/w piriformis syndrome) present.  ?Neurological:  ?   Mental Status: She is alert and oriented to person, place, and time.  ?Psychiatric:     ?   Mood and Affect: Mood normal.     ?   Behavior: Behavior normal.  ? ? ?Diabetic Foot Exam - Simple   ?No data filed ?  ?  ? ?Lab Results  ?Component Value Date  ? WBC 6.3 07/09/2021  ? HGB 13.4 07/09/2021  ? HCT 39.9 07/09/2021  ? PLT 272 07/09/2021  ? GLUCOSE 98 07/09/2021  ? CHOL 173  07/09/2021  ? TRIG 63 07/09/2021  ? HDL 69 07/09/2021  ? [place] 92 07/09/2021  ? ALT 15 07/09/2021  ? AST 19 07/09/2021  ? NA 142 07/09/2021  ? K 4.4 07/09/2021  ? CL 103 07/09/2021  ? CREATININE 0.71 07/09/2021  ? BUN 26 07/09/2021  ? CO2 26 07/09/2021  ? TSH 0.903 07/09/2021  ? HGBA1C 5.5 07/09/2021  ? ? ? ? ?  Assessment & Plan:  ? ?Problem List Items Addressed This Visit   ? ?  ? Cardiovascular and Mediastinum  ? Essential hypertension, benign - Primary  ?  Well controlled.  ?No changes to medicines. Continue Hydrochlorthiazide 12.5 mg daily,  Losartan 50 mg daily. ?Continue to work on eating a healthy diet and exercise.  ?Labs drawn today.  ? ?  ?  ? Relevant Orders  ? CBC with Differential/Platelet (Completed)  ? Comprehensive metabolic panel (Completed)  ? TSH (Completed)  ?  ? Nervous and Auditory  ? Piriformis syndrome of both sides  ?  Exercises recommended.  ?Start on meloxicam 15 mg daily.  ?  ?  ?  ? Other  ? Mixed hyperlipidemia  ?  Well controlled.  ?No changes to medicines. Continue Crestor 5 mg daily.   ?Continue to work on eating a healthy diet and exercise.  ?Labs drawn today.  ? ?  ?  ? Relevant Orders  ? Lipid panel (Completed)  ? Prediabetes  ?  Well controlled.  ?Recommend continue to work on eating healthy diet and exercise. ? ?  ?  ? Relevant Orders  ? Hemoglobin A1c (Completed)  ? B12 deficiency  ?  The current medical regimen is effective;  continue present plan and medications. ? ?  ?  ? BMI 26.0-26.9,adult  ?  Recommend continue to work on eating healthy diet and exercise. ? ?  ?  ?. ? ?Meds ordered this encounter  ?Medications  ? meloxicam (MOBIC) 15 MG tablet  ?  Sig: Take 1 tablet (15 mg total) by mouth daily.  ?  Dispense:  30 tablet  ?  Refill:  2  ? ? ?Orders Placed This Encounter  ?Procedures  ? CBC with Differential/Platelet  ? Comprehensive metabolic panel  ? Lipid panel  ? TSH  ? Hemoglobin A1c  ?  ? ?Follow-up: Return in about 7 months (around 02/05/2022) for awv, chronic  fasting. ? ?An After Visit Summary was printed and given to the patient. ? ?Rochel Brome, MD ?[place] ?[phone] ?

## 2021-07-10 LAB — CBC WITH DIFFERENTIAL/PLATELET
Basophils Absolute: 0.1 10*3/uL (ref 0.0–0.2)
Basos: 2 %
EOS (ABSOLUTE): 0.5 10*3/uL — ABNORMAL HIGH (ref 0.0–0.4)
Eos: 8 %
Hematocrit: 39.9 % (ref 34.0–46.6)
Hemoglobin: 13.4 g/dL (ref 11.1–15.9)
Immature Grans (Abs): 0 10*3/uL (ref 0.0–0.1)
Immature Granulocytes: 0 %
Lymphocytes Absolute: 0.9 10*3/uL (ref 0.7–3.1)
Lymphs: 15 %
MCH: 29.9 pg (ref 26.6–33.0)
MCHC: 33.6 g/dL (ref 31.5–35.7)
MCV: 89 fL (ref 79–97)
Monocytes Absolute: 0.6 10*3/uL (ref 0.1–0.9)
Monocytes: 9 %
Neutrophils Absolute: 4.2 10*3/uL (ref 1.4–7.0)
Neutrophils: 66 %
Platelets: 272 10*3/uL (ref 150–450)
RBC: 4.48 x10E6/uL (ref 3.77–5.28)
RDW: 12.3 % (ref 11.7–15.4)
WBC: 6.3 10*3/uL (ref 3.4–10.8)

## 2021-07-10 LAB — COMPREHENSIVE METABOLIC PANEL
ALT: 15 IU/L (ref 0–32)
AST: 19 IU/L (ref 0–40)
Albumin/Globulin Ratio: 2 (ref 1.2–2.2)
Albumin: 4.9 g/dL — ABNORMAL HIGH (ref 3.8–4.8)
Alkaline Phosphatase: 90 IU/L (ref 44–121)
BUN/Creatinine Ratio: 37 — ABNORMAL HIGH (ref 12–28)
BUN: 26 mg/dL (ref 8–27)
Bilirubin Total: 0.6 mg/dL (ref 0.0–1.2)
CO2: 26 mmol/L (ref 20–29)
Calcium: 9.6 mg/dL (ref 8.7–10.3)
Chloride: 103 mmol/L (ref 96–106)
Creatinine, Ser: 0.71 mg/dL (ref 0.57–1.00)
Globulin, Total: 2.5 g/dL (ref 1.5–4.5)
Glucose: 98 mg/dL (ref 70–99)
Potassium: 4.4 mmol/L (ref 3.5–5.2)
Sodium: 142 mmol/L (ref 134–144)
Total Protein: 7.4 g/dL (ref 6.0–8.5)
eGFR: 93 mL/min/{1.73_m2} (ref >59)

## 2021-07-10 LAB — TSH: TSH: 0.903 u[IU]/mL (ref 0.450–4.500)

## 2021-07-10 LAB — LIPID PANEL
Chol/HDL Ratio: 2.5 ratio (ref 0.0–4.4)
Cholesterol, Total: 173 mg/dL (ref 100–199)
HDL: 69 mg/dL (ref >39)
LDL Chol Calc (NIH): 92 mg/dL (ref 0–99)
Triglycerides: 63 mg/dL (ref 0–149)
VLDL Cholesterol Cal: 12 mg/dL (ref 5–40)

## 2021-07-10 LAB — HEMOGLOBIN A1C
Est. average glucose Bld gHb Est-mCnc: 111 mg/dL
Hgb A1c MFr Bld: 5.5 % (ref 4.8–5.6)

## 2021-07-12 ENCOUNTER — Encounter: Payer: Self-pay | Admitting: Family Medicine

## 2021-07-12 DIAGNOSIS — Z6826 Body mass index (BMI) 26.0-26.9, adult: Secondary | ICD-10-CM | POA: Insufficient documentation

## 2021-07-12 DIAGNOSIS — E538 Deficiency of other specified B group vitamins: Secondary | ICD-10-CM | POA: Insufficient documentation

## 2021-07-12 NOTE — Assessment & Plan Note (Signed)
Recommend continue to work on eating healthy diet and exercise.  

## 2021-07-12 NOTE — Assessment & Plan Note (Signed)
Well controlled.  ?No changes to medicines. Continue Hydrochlorthiazide 12.5 mg daily,  Losartan 50 mg daily. ?Continue to work on eating a healthy diet and exercise.  ?Labs drawn today.  ? ?

## 2021-07-12 NOTE — Assessment & Plan Note (Signed)
Well controlled.  ?No changes to medicines. Continue Crestor 5 mg daily.   ?Continue to work on eating a healthy diet and exercise.  ?Labs drawn today.  ? ?

## 2021-07-12 NOTE — Assessment & Plan Note (Signed)
The current medical regimen is effective;  continue present plan and medications.  

## 2021-07-12 NOTE — Assessment & Plan Note (Signed)
Exercises recommended.  ?Start on meloxicam 15 mg daily.  ?

## 2021-07-12 NOTE — Assessment & Plan Note (Signed)
Well controlled. Recommend continue to work on eating healthy diet and exercise.  

## 2021-08-06 ENCOUNTER — Other Ambulatory Visit: Payer: Self-pay | Admitting: Family Medicine

## 2021-08-11 ENCOUNTER — Other Ambulatory Visit: Payer: Self-pay | Admitting: Family Medicine

## 2021-08-11 NOTE — Telephone Encounter (Signed)
Refill sent to pharmacy.   

## 2021-08-25 DIAGNOSIS — F331 Major depressive disorder, recurrent, moderate: Secondary | ICD-10-CM | POA: Diagnosis not present

## 2021-08-25 DIAGNOSIS — F411 Generalized anxiety disorder: Secondary | ICD-10-CM | POA: Diagnosis not present

## 2021-08-25 DIAGNOSIS — N943 Premenstrual tension syndrome: Secondary | ICD-10-CM | POA: Diagnosis not present

## 2021-09-24 ENCOUNTER — Telehealth (INDEPENDENT_AMBULATORY_CARE_PROVIDER_SITE_OTHER): Payer: PPO | Admitting: Family Medicine

## 2021-09-24 ENCOUNTER — Encounter: Payer: Self-pay | Admitting: Family Medicine

## 2021-09-24 VITALS — Ht 63.0 in | Wt 154.0 lb

## 2021-09-24 DIAGNOSIS — H1031 Unspecified acute conjunctivitis, right eye: Secondary | ICD-10-CM | POA: Diagnosis not present

## 2021-09-24 MED ORDER — CIPROFLOXACIN HCL 0.3 % OP SOLN: 2.0000 [drp] | OPHTHALMIC | 0 refills | Status: AC

## 2021-09-24 NOTE — Progress Notes (Signed)
Virtual Visit via Video Note   This visit type was conducted due to national recommendations. All issues noted in this document were discussed and addressed.  A limited physical exam was performed with this format.  A verbal consent was obtained for the virtual visit.   Date:  10/05/2021   ID:  Karen Clay, DOB 09/25/1955, MRN [id number]  Patient Location: Home Provider Location: Office/Clinic  PCP:  Rochel Brome, MD   Evaluation Performed:  acute  Chief Complaint:  pink eye  History of Present Illness:    Karen Clay is a 66 y.o. female with pink eye with discharge and redness which began this morning.    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   Vitamin B deficiency     Past Surgical History:  Procedure Laterality Date   CESAREAN SECTION     REPLACEMENT TOTAL KNEE Right    unicompartmental knee replacement Left     Family History  Problem Relation Age of Onset   Aneurysm Mother    Heart failure Father    CAD Father     Social History   Socioeconomic History   Marital status: Married    Spouse name: Karen Clay   Number of children: 3   Years of education: Not on file   Highest education level: Not on file  Occupational History   Occupation: Self Employeed, pottery painting class  Tobacco Use   Smoking status: Former   Smokeless tobacco: Never  Scientific laboratory technician Use: Never used  Substance and Sexual Activity   Alcohol use: Not Currently    Comment: occasional glass  of wine   Drug use: No   Sexual activity: Not on file  Other Topics Concern   Not on file  Social History Narrative   Not on file   Social Determinants of Health   Financial Resource Strain: Not on file  Food Insecurity: No Food Insecurity   Worried About Charity fundraiser in the Last Year: Never true   Arboriculturist in the Last Year: Never true  Transportation Needs: No Transportation Needs   Lack of Transportation (Medical): No   Lack of Transportation (Non-Medical): No  Physical Activity: Sufficiently Active   Days of Exercise per Week: 7 days   Minutes of Exercise per Session: 60 min  Stress: Not on file  Social Connections: Not on file  Intimate Partner Violence: Not At Risk   Fear of Current or Ex-Partner: No   Emotionally Abused: No   Physically Abused: No   Sexually Abused: No    Outpatient Medications Prior to Visit  Medication Sig Dispense Refill   buPROPion (WELLBUTRIN XL) 300 MG 24 hr tablet Take 300 mg by mouth daily.      DODEX 1000 MCG/ML injection INJECT 1 ML INTRAMUSCULARLY ONCE A MONTH 3 mL 3   FLUoxetine (PROZAC) 20 MG capsule Take 40 mg by mouth daily.  hydrochlorothiazide (MICROZIDE) 12.5 MG capsule TAKE 1 CAPSULE BY MOUTH DAILY WITH LOSARTAN 90 capsule 0   levothyroxine (SYNTHROID) 75 MCG tablet TAKE 1 TABLET BY MOUTH ONCE DAILY 90 tablet 2   losartan (COZAAR) 50 MG tablet TAKE 1 TABLET BY MOUTH DAILY WITH HCTZ 90 tablet 0   meloxicam (MOBIC) 15 MG tablet Take 1 tablet (15 mg total) by mouth daily. 30 tablet 2   MISC NATURAL PRODUCTS PO Take 1 tablet by mouth daily. Blood builder supplement from health food store     [place] Take 2 tablets by mouth daily. Biocell Collagen for joint/skin/hair     potassium chloride SA (KLOR-CON M) 20 MEQ tablet TAKE 1 TABLET BY MOUTH DAILY 90 tablet 0   Probiotic Product (PHILLIPS COLON HEALTH PO) Take 1 capsule by mouth daily.     triamcinolone ointment (KENALOG) 0.1 % Apply 1 application  topically 2 (two) times daily. 80 g 1   rosuvastatin (CRESTOR) 5 MG tablet TAKE 1 TABLET BY MOUTH DAILY 90 tablet 0   No facility-administered medications prior to visit.    Allergies:   Ace inhibitors, Niaspan [niacin], and Zetia [ezetimibe]   Social History   Tobacco Use   Smoking status: Former   Smokeless tobacco: Never  Scientific laboratory technician Use: Never used  Substance Use Topics   Alcohol use: Not Currently    Comment: occasional glass of wine   Drug use: No     Review of Systems  Constitutional:  Negative for chills, fever and malaise/fatigue.  HENT:  Negative for ear pain, sinus pain and sore throat.   Eyes:  Positive for discharge and redness.  Respiratory:  Negative for cough and shortness of breath.   Cardiovascular:  Negative for chest pain.  Musculoskeletal:  Negative for myalgias.  Neurological:  Negative for headaches.    Labs/Other Tests and Data Reviewed:    Recent Labs: 07/09/2021: ALT 15; BUN 26; Creatinine, Ser 0.71; Hemoglobin 13.4; Platelets 272; Potassium 4.4; Sodium 142; TSH 0.903   Recent Lipid Panel Lab Results  Component Value Date/Time   CHOL 173 07/09/2021 11:31 AM   TRIG 63 07/09/2021 11:31 AM   HDL 69 07/09/2021 11:31 AM   CHOLHDL 2.5 07/09/2021 11:31 AM   LDLCALC 92 07/09/2021 11:31 AM    Wt Readings from Last 3 Encounters:  09/24/21 154 lb (69.9 kg)  07/09/21 149 lb (67.6 kg)  03/17/21 152 lb (68.9 kg)     Objective:    Vital Signs:  Ht '5\' 3"'$  (1.6 m)   Wt 154 lb (69.9 kg)   BMI 27.28 kg/m    Physical Exam Vitals reviewed.  Constitutional:      Appearance: Normal appearance.  Eyes:     General:        Right eye: Discharge present.     Comments: Right eye hyperemia.  Neurological:     Mental Status: She is alert.     ASSESSMENT & PLAN:    Problem List Items Addressed This Visit       Other   Acute bacterial conjunctivitis of right eye - Primary    Start ciprofloxacin 0.3% ophthalmic solution.      .  No  orders of the defined types were placed in this encounter.    Meds ordered this encounter  Medications   ciprofloxacin (CILOXAN) 0.3 % ophthalmic solution    Sig: Place 2 drops into the right eye every 4 (four) hours while awake for 7 days.  Administer 1 drop, every 2 hours, while awake, for 2 days. Then 1 drop, every 4 hours, while awake, for the next 5 days.    Dispense:  5 mL    Refill:  0   I spent 10 minutes dedicated to the care of this patient on the date of this encounter to include face-to-face time with the patient.  Follow Up:  In Person prn Signed, Rochel Brome, MD  10/05/2021 11:43 PM    [place]

## 2021-09-25 NOTE — Assessment & Plan Note (Addendum)
Start ciprofloxacin 0.3% ophthalmic solution.

## 2021-10-02 ENCOUNTER — Other Ambulatory Visit: Payer: Self-pay | Admitting: Family Medicine

## 2021-10-27 DIAGNOSIS — G4733 Obstructive sleep apnea (adult) (pediatric): Secondary | ICD-10-CM | POA: Diagnosis not present

## 2021-11-11 DIAGNOSIS — Z01419 Encounter for gynecological examination (general) (routine) without abnormal findings: Secondary | ICD-10-CM | POA: Diagnosis not present

## 2021-11-11 DIAGNOSIS — Z1231 Encounter for screening mammogram for malignant neoplasm of breast: Secondary | ICD-10-CM | POA: Diagnosis not present

## 2021-11-11 LAB — HM MAMMOGRAPHY

## 2021-11-12 ENCOUNTER — Other Ambulatory Visit: Payer: Self-pay | Admitting: Family Medicine

## 2021-11-19 ENCOUNTER — Encounter: Payer: Self-pay | Admitting: Family Medicine

## 2022-01-06 DIAGNOSIS — L720 Epidermal cyst: Secondary | ICD-10-CM | POA: Diagnosis not present

## 2022-01-06 DIAGNOSIS — L298 Other pruritus: Secondary | ICD-10-CM | POA: Diagnosis not present

## 2022-01-20 ENCOUNTER — Encounter: Payer: Self-pay | Admitting: Podiatry

## 2022-01-20 ENCOUNTER — Ambulatory Visit: Payer: PPO | Admitting: Podiatry

## 2022-01-20 DIAGNOSIS — L84 Corns and callosities: Secondary | ICD-10-CM

## 2022-01-20 NOTE — Progress Notes (Signed)
Subjective:   Patient ID: Karen Clay, female   DOB: 67 y.o.   MRN: [id number]   HPI Patient has developed painful lesions on the bottom of both feet over the last 6 months that are gradually becoming more aggravating   ROS      Objective:  Physical Exam  Neuro vascular status intact with keratotic lesion plantar aspect bilateral that are painful when pressed second metatarsal     Assessment:  Chronic lesion formation second metatarsal bilateral     Plan:  Sterile debridement no iatrogenic bleeding reappoint routine care

## 2022-01-21 DIAGNOSIS — N943 Premenstrual tension syndrome: Secondary | ICD-10-CM | POA: Diagnosis not present

## 2022-01-21 DIAGNOSIS — F411 Generalized anxiety disorder: Secondary | ICD-10-CM | POA: Diagnosis not present

## 2022-01-21 DIAGNOSIS — F331 Major depressive disorder, recurrent, moderate: Secondary | ICD-10-CM | POA: Diagnosis not present

## 2022-02-09 ENCOUNTER — Other Ambulatory Visit: Payer: Self-pay | Admitting: Family Medicine

## 2022-02-09 ENCOUNTER — Ambulatory Visit (INDEPENDENT_AMBULATORY_CARE_PROVIDER_SITE_OTHER): Payer: PPO | Admitting: Family Medicine

## 2022-02-09 ENCOUNTER — Encounter: Payer: Self-pay | Admitting: Family Medicine

## 2022-02-09 VITALS — BP 124/60 | HR 66 | Temp 97.2°F | Resp 16 | Ht 63.0 in | Wt 172.0 lb

## 2022-02-09 DIAGNOSIS — Z0000 Encounter for general adult medical examination without abnormal findings: Secondary | ICD-10-CM

## 2022-02-09 DIAGNOSIS — Z6830 Body mass index (BMI) 30.0-30.9, adult: Secondary | ICD-10-CM | POA: Diagnosis not present

## 2022-02-09 DIAGNOSIS — G5703 Lesion of sciatic nerve, bilateral lower limbs: Secondary | ICD-10-CM

## 2022-02-09 DIAGNOSIS — E6609 Other obesity due to excess calories: Secondary | ICD-10-CM

## 2022-02-09 DIAGNOSIS — E538 Deficiency of other specified B group vitamins: Secondary | ICD-10-CM | POA: Diagnosis not present

## 2022-02-09 DIAGNOSIS — F330 Major depressive disorder, recurrent, mild: Secondary | ICD-10-CM

## 2022-02-09 DIAGNOSIS — Z13820 Encounter for screening for osteoporosis: Secondary | ICD-10-CM | POA: Diagnosis not present

## 2022-02-09 DIAGNOSIS — R011 Cardiac murmur, unspecified: Secondary | ICD-10-CM | POA: Diagnosis not present

## 2022-02-09 DIAGNOSIS — Z23 Encounter for immunization: Secondary | ICD-10-CM

## 2022-02-09 DIAGNOSIS — M4316 Spondylolisthesis, lumbar region: Secondary | ICD-10-CM | POA: Diagnosis not present

## 2022-02-09 DIAGNOSIS — M545 Low back pain, unspecified: Secondary | ICD-10-CM

## 2022-02-09 DIAGNOSIS — E66811 Obesity, class 1: Secondary | ICD-10-CM

## 2022-02-09 NOTE — Progress Notes (Signed)
Subjective:  Patient ID: Karen Clay, female    DOB: 07-14-1955  Age: 66 y.o. MRN: [id number]  Chief Complaint  Patient presents with   Annual Exam    HPI Patient is a 66 year old white female who presents for an annual wellness visit as well as a chronic visit.  Patient is currently on Wellbutrin and Prozac for her depression which is well controlled.  She has been on these for years.  Patient has some low back pain she is complaining of events related both of her buttocks.  She has tried stretching which has not helped.  Patient has hyperlipidemia, hypertension, and prediabetes.  Patient eats healthy.  She exercises.  Her hypertension is controlled with HCTZ 12.5 mg daily and losartan 50 mg daily.  Her hyperlipidemia is treated with rosuvastatin 5 mg nightly.  Hypothyroidism: Currently on Synthroid 75 mcg every morning.  Patient also has B12 deficiency and takes a B12 shot once a month.   Well Adult Physical: Patient here for a comprehensive physical exam.The patient reports problems - persistent back pain Do you take any herbs or supplements that were not prescribed by a doctor? no Are you taking calcium supplements? No, but takes Are you taking aspirin daily? no  Physical ("At Risk" items are starred): Patient's last physical exam was 1 year ago .  Safety: reviewed ; Patient wears a seat belt, has smoke detectors, has carbon monoxide detectors, practices appropriate gun safety, and wears sunscreen with extended sun exposure. Dental Care: biannual cleanings, brushes and flosses daily. Ophthalmology/Optometry: Annual visit.  Hearing loss: none Vision impairments: none     02/09/2022   10:28 AM 07/09/2021   10:32 AM 02/04/2021   11:41 AM 01/08/2021    9:49 AM 07/08/2020    9:39 AM  Fall Risk   Falls in the past year? 1 0 0 0 0  Number falls in past yr: 0 0 0 0 0  Injury with Fall? 0 0 0 0 0  Risk for fall due to : No Fall Risks  No Fall Risks No Fall Risks No Fall  Risks  Follow up Falls evaluation completed Falls evaluation completed Falls evaluation completed Falls evaluation completed Falls evaluation completed        02/09/2022   10:28 AM 07/09/2021   10:32 AM 02/04/2021   11:34 AM 01/08/2021    9:50 AM 03/26/2020    9:00 AM  Depression screen PHQ 2/9  Decreased Interest 0 0 0 0 0  Down, Depressed, Hopeless 0 0 0 0 0  PHQ - 2 Score 0 0 0 0 0  Altered sleeping 0  0 0   Tired, decreased energy 0  0 0   Change in appetite 0  0 0   Feeling bad or failure about yourself  0  0 0   Trouble concentrating 0  0 0   Moving slowly or fidgety/restless 0  0 0   Suicidal thoughts 0  0 0   PHQ-9 Score 0  0 0   Difficult doing work/chores Not difficult at all  Not difficult at all Not difficult at all        Functional Status Survey: Is the patient deaf or have difficulty hearing?: No Does the patient have difficulty seeing, even when wearing glasses/contacts?: No Does the patient have difficulty concentrating, remembering, or making decisions?: No Does the patient have difficulty walking or climbing stairs?: Yes (currently having issues with back pain and radiating pain in left leg.) Does  the patient have difficulty dressing or bathing?: No Does the patient have difficulty doing errands alone such as visiting a doctor's office or shopping?: No   Health Maintenance  Topic Date Due   Yearly kidney health urinalysis for diabetes  Never done   Hepatitis C Screening: USPSTF Recommendation to screen - Ages 42-79 yo.  Never done   Zoster (Shingles) Vaccine (2 of 2) 04/05/2018   Pneumonia Vaccine (3 - PPSV23 or PCV20) 06/02/2018   DEXA scan (bone density measurement)  Never done   Eye exam for diabetics  03/10/2020   Tetanus Vaccine  08/26/2020   Complete foot exam   07/08/2021   COVID-19 Vaccine (5 - Pfizer series) 11/08/2021   Hemoglobin A1C  01/09/2022   Yearly kidney function blood test for diabetes  07/10/2022   Mammogram  11/12/2022   Colon Cancer  Screening  06/24/2027   Flu Shot  Completed   HPV Vaccine  Aged Out     Social Hx   Social History   Socioeconomic History   Marital status: Married    Spouse name: [name]   Number of children: 3   Years of education: Not on file   Highest education level: Not on file  Occupational History   Occupation: Self Employeed, pottery painting class  Tobacco Use   Smoking status: Former   Smokeless tobacco: Never  Scientific laboratory technician Use: Never used  Substance and Sexual Activity   Alcohol use: Not Currently    Comment: occasional glass of wine   Drug use: No   Sexual activity: Not on file  Other Topics Concern   Not on file  Social History Narrative   Not on file   Social Determinants of Health   Financial Resource Strain: Low Risk  (02/09/2022)   Overall Financial Resource Strain (CARDIA)    Difficulty of Paying Living Expenses: Not hard at all  Food Insecurity: No Food Insecurity (02/09/2022)   Hunger Vital Sign    Worried About Running Out of Food in the Last Year: Never true    [place] in the Last Year: Never true  Transportation Needs: Unknown (02/09/2022)   PRAPARE - Hydrologist (Medical): No    Lack of Transportation (Non-Medical): Not on file  Physical Activity: Insufficiently Active (02/09/2022)   Exercise Vital Sign    Days of Exercise per Week: 2 days    Minutes of Exercise per Session: 50 min  Stress: No Stress Concern Present (02/09/2022)   [place]    Feeling of Stress : Not at all  Social Connections: [place] (02/09/2022)   Social Connection and Isolation Panel [NHANES]    Frequency of Communication with Friends and Family: More than three times a week    Frequency of Social Gatherings with Friends and Family: Twice a week    Attends Religious Services: More than 4 times per year    Active Member of [employer] or Organizations: Yes    Attends Arts development officer: More than 4 times per year    Marital Status: Married   Past Medical History:  Diagnosis Date   Atrophy of thyroid    Benign carcinoid tumor of the stomach    CTS (carpal tunnel syndrome)    Essential hypertension    Localized osteoarthritis of knees, bilateral 02/14/2014   Lumbar pain 07/09/2019   Major depressive disorder    Melena  Mixed hyperlipidemia    Nonalcoholic fatty liver disease    Osteoarthritis    S/P left unicompartmental knee replacement 07/25/2014   Sleep apnea    Status post total right knee replacement using cement 01/29/2016   Vitamin B deficiency    Family History  Problem Relation Age of Onset   Aneurysm Mother    Heart failure Father    CAD Father     Review of Systems  Constitutional:  Negative for chills, fatigue and fever.  HENT:  Negative for congestion, rhinorrhea and sore throat.   Respiratory:  Negative for cough and shortness of breath.   Cardiovascular:  Negative for chest pain.  Gastrointestinal:  Negative for abdominal pain, constipation, diarrhea, nausea and vomiting.  Genitourinary:  Negative for dysuria and urgency.  Musculoskeletal:  Positive for back pain (w/radiating pain in left leg.). Negative for myalgias.  Neurological:  Negative for dizziness, weakness, light-headedness and headaches.  Psychiatric/Behavioral:  Negative for dysphoric mood. The patient is not nervous/anxious.      Objective:  BP 124/60   Pulse 66   Temp (!) 97.2 F (36.2 C)   Resp 16   Ht '5\' 3"'$  (1.6 m)   Wt 172 lb (78 kg)   BMI 30.47 kg/m      02/09/2022   10:20 AM 09/24/2021   10:03 AM 07/09/2021   10:28 AM  BP/Weight  Systolic BP 528  413  Diastolic BP 60  64  Wt. (Lbs) 172 154 149  BMI 30.47 kg/m2 27.28 kg/m2 26.39 kg/m2    Physical Exam Vitals reviewed.  Constitutional:      Appearance: Normal appearance. She is normal weight.  Neck:     Vascular: No carotid bruit.  Cardiovascular:     Rate and Rhythm: Normal rate  and regular rhythm.     Heart sounds: Murmur heard.  Pulmonary:     Effort: Pulmonary effort is normal. No respiratory distress.     Breath sounds: Normal breath sounds.  Abdominal:     General: Abdomen is flat. Bowel sounds are normal.     Palpations: Abdomen is soft.     Tenderness: There is no abdominal tenderness.  Musculoskeletal:        General: Tenderness (Over piriformis muscles bilaterally.) present. No swelling. Normal range of motion.  Neurological:     Mental Status: She is alert and oriented to person, place, and time.  Psychiatric:        Mood and Affect: Mood normal.        Behavior: Behavior normal.     Lab Results  Component Value Date   WBC 6.3 07/09/2021   HGB 13.4 07/09/2021   HCT 39.9 07/09/2021   PLT 272 07/09/2021   GLUCOSE 98 07/09/2021   CHOL 173 07/09/2021   TRIG 63 07/09/2021   HDL 69 07/09/2021   LDLCALC 92 07/09/2021   ALT 15 07/09/2021   AST 19 07/09/2021   NA 142 07/09/2021   K 4.4 07/09/2021   CL 103 07/09/2021   CREATININE 0.71 07/09/2021   BUN 26 07/09/2021   CO2 26 07/09/2021   TSH 0.903 07/09/2021   HGBA1C 5.5 07/09/2021      Assessment & Plan:   Problem List Items Addressed This Visit       Nervous and Auditory   Piriformis syndrome of both sides    Education given for exercise.  Call back if not working Refer to physical therapy.        Other  B12 deficiency    Continue B12 shots monthly.      Encounter for Medicare annual wellness exam    Things to do to keep yourself healthy  - Exercise at least 30-45 minutes a day, 3-4 days a week.  - Eat a low-fat diet with lots of fruits and vegetables, up to 7-9 servings per day.  - Seatbelts can save your life. Wear them always.  - Smoke detectors on every level of your home, check batteries every year.  - Eye Doctor - have an eye exam every 1-2 years  - Alcohol -  If you drink, do it moderately, less than 2 drinks per day.  - [place]. Choose  someone to speak for you if you are not able.  - Depression is common in our stressful world.If you're feeling down or losing interest in things you normally enjoy, please come in for a visit.  - Violence - If anyone is threatening or hurting you, please call immediately.  Recommend RSV vaccine and Covid 19 booster.       Lumbar pain   Relevant Orders   DG Lumbar Spine Complete   Heart murmur    Order echocardiogram      Relevant Orders   ECHOCARDIOGRAM COMPLETE   Need for influenza vaccination - Primary   Relevant Orders   Flu Vaccine QUAD High Dose(Fluad) (Completed)   Osteoporosis screening    Order DEXA scan education about calcium and vitamin D given.      Relevant Orders   DG Bone Density   Mild recurrent major depression ([place])    Continue Prozac and Wellbutrin.  Well-controlled       Class 1 obesity due to excess calories with body mass index (BMI) of 30.0 to 30.9 in adult    Continue to work on healthy diet and exercise.          No orders of the defined types were placed in this encounter.   These are the goals we discussed:  [place] (see longitudinal plan of care for additional care plan information)  Current Barriers:  Chronic Disease Management support, education, and care coordination needs related to Hypertension, Hyperlipidemia, and Depression   Hypertension BP Readings from Last 3 Encounters:  10/23/19 124/72  07/09/19 126/70  12/17/16 123/74  Pharmacist Clinical Goal(s): Over the next 90 days, patient will work with PharmD and providers to maintain BP goal <130/80 Current regimen:  Hydrochlorothiazide 12.5 mg daily Losartan 50 mg daily Interventions: Discussed diet and exercise.  Coordinating combination losartan-hctz 50-12 mg tablet sent to pharmacy to reduce pill burden.  Patient self care activities - Over the next 90 days, patient will: Keep up the good work with diet and exercise.  Begin  taking combination losartan-hctz 50-12 mg daily when out of current supply.   Hyperlipidemia Lab Results  Component Value Date/Time   LDLCALC 85 07/09/2019 10:30 AM  Pharmacist Clinical Goal(s): Over the next 90 days, patient will work with PharmD and providers to maintain LDL goal < 100 Current regimen:  Crestor 5 mg daily  Interventions: Reviewed medication adherence.  Discussed benefits of healthy diet and exercise.  Encouraged goal of 150 minutes each week of moderate activity.  Patient self care activities - Over the next 90 days, patient will: Continue taking medication as prescribed.   Depression  Pharmacist Clinical Goal(s) Over the next 90 days, patient will work  with PharmD and providers to manage symptoms of depression  Current regimen:  Bupropion XL 300 mg daily Fluoxetine 40 mg daily Interventions: Patient sees [facility] counseling for medication.  Reports symptoms well managed on current medication.  Discussed the benefit of continuing exercise.  Patient self care activities - Over the next 90 days, patient will: Continue taking medication as prescribed.   Medication management Pharmacist Clinical Goal(s): Over the next 90 days, patient will work with PharmD and providers to maintain optimal medication adherence Current pharmacy: [facility] Interventions Comprehensive medication review performed. Continue current medication management strategy Patient self care activities - Over the next 90 days, patient will: Focus on medication adherence by using pill box Take medications as prescribed Report any questions or concerns to PharmD and/or provider(s)  Initial goal documentation          This is a list of the screening recommended for you and due dates:  Health Maintenance  Topic Date Due   Yearly kidney health urinalysis for diabetes  Never done   Hepatitis C Screening: USPSTF Recommendation to screen - Ages 34-79 yo.  Never done    Zoster (Shingles) Vaccine (2 of 2) 04/05/2018   Pneumonia Vaccine (3 - PPSV23 or PCV20) 06/02/2018   DEXA scan (bone density measurement)  Never done   Eye exam for diabetics  03/10/2020   Tetanus Vaccine  08/26/2020   Complete foot exam   07/08/2021   COVID-19 Vaccine (5 - Pfizer series) 11/08/2021   Hemoglobin A1C  01/09/2022   Yearly kidney function blood test for diabetes  07/10/2022   Mammogram  11/12/2022   Colon Cancer Screening  06/24/2027   Flu Shot  Completed   HPV Vaccine  Aged Out     AN INDIVIDUALIZED CARE PLAN: was established or reinforced today.   SELF MANAGEMENT: The patient and I together assessed ways to personally work towards obtaining the recommended goals  Support needs The patient and/or family needs were assessed and services were offered and not necessary at this time.    Follow-up: Return in about 6 months (around 08/11/2022) for chronic fasting.  Rochel Brome, MD [facility] [phone]

## 2022-02-09 NOTE — Patient Instructions (Addendum)
Things to do to keep yourself healthy  - Exercise at least 30-45 minutes a day, 3-4 days a week.  - Eat a low-fat diet with lots of fruits and vegetables, up to 7-9 servings per day.  - Seatbelts can save your life. Wear them always.  - Smoke detectors on every level of your home, check batteries every year.  - Eye Doctor - have an eye exam every 1-2 years  - Alcohol -  If you drink, do it moderately, less than 2 drinks per day.  - [place]. Choose someone to speak for you if you are not able.  - Depression is common in our stressful world.If you're feeling down or losing interest in things you normally enjoy, please come in for a visit.  - Violence - If anyone is threatening or hurting you, please call immediately.  Recommend RSV vaccine and Covid 19 booster.

## 2022-02-12 DIAGNOSIS — F330 Major depressive disorder, recurrent, mild: Secondary | ICD-10-CM | POA: Insufficient documentation

## 2022-02-12 DIAGNOSIS — Z23 Encounter for immunization: Secondary | ICD-10-CM | POA: Insufficient documentation

## 2022-02-12 DIAGNOSIS — Z13820 Encounter for screening for osteoporosis: Secondary | ICD-10-CM | POA: Insufficient documentation

## 2022-02-12 DIAGNOSIS — M545 Low back pain, unspecified: Secondary | ICD-10-CM | POA: Insufficient documentation

## 2022-02-12 DIAGNOSIS — R011 Cardiac murmur, unspecified: Secondary | ICD-10-CM | POA: Insufficient documentation

## 2022-02-12 DIAGNOSIS — Z0000 Encounter for general adult medical examination without abnormal findings: Secondary | ICD-10-CM | POA: Insufficient documentation

## 2022-02-12 DIAGNOSIS — E6609 Other obesity due to excess calories: Secondary | ICD-10-CM | POA: Insufficient documentation

## 2022-02-12 NOTE — Assessment & Plan Note (Signed)
Continue B12 shots monthly.

## 2022-02-12 NOTE — Assessment & Plan Note (Signed)
Continue Prozac and Wellbutrin.  Well-controlled

## 2022-02-12 NOTE — Assessment & Plan Note (Signed)
Order echocardiogram

## 2022-02-12 NOTE — Assessment & Plan Note (Signed)
Things to do to keep yourself healthy  - Exercise at least 30-45 minutes a day, 3-4 days a week.  - Eat a low-fat diet with lots of fruits and vegetables, up to 7-9 servings per day.  - Seatbelts can save your life. Wear them always.  - Smoke detectors on every level of your home, check batteries every year.  - Eye Doctor - have an eye exam every 1-2 years  - Alcohol -  If you drink, do it moderately, less than 2 drinks per day.  - [place]. Choose someone to speak for you if you are not able.  - Depression is common in our stressful world.If you're feeling down or losing interest in things you normally enjoy, please come in for a visit.  - Violence - If anyone is threatening or hurting you, please call immediately.  Recommend RSV vaccine and Covid 19 booster.

## 2022-02-12 NOTE — Assessment & Plan Note (Signed)
Order DEXA scan education about calcium and vitamin D given.

## 2022-02-12 NOTE — Assessment & Plan Note (Signed)
Education given for exercise.  Call back if not working Refer to physical therapy.

## 2022-02-12 NOTE — Assessment & Plan Note (Signed)
Continue to work on healthy diet and exercise.   

## 2022-02-15 ENCOUNTER — Telehealth: Payer: Self-pay | Admitting: Family Medicine

## 2022-02-15 NOTE — Telephone Encounter (Signed)
   Karen Clay Karen Clay has been scheduled for the following appointment:  WHAT: BONE DENSITY WHERE: [place] [facility] DATE: 03/04/22 TIME: 2:30 PM CHECK-IN  A message has been left for the patient.

## 2022-02-16 ENCOUNTER — Encounter: Payer: Self-pay | Admitting: Family Medicine

## 2022-02-17 ENCOUNTER — Other Ambulatory Visit: Payer: Self-pay

## 2022-02-17 DIAGNOSIS — M545 Low back pain, unspecified: Secondary | ICD-10-CM

## 2022-02-19 ENCOUNTER — Ambulatory Visit: Payer: PPO | Attending: Family Medicine

## 2022-02-19 DIAGNOSIS — R011 Cardiac murmur, unspecified: Secondary | ICD-10-CM

## 2022-02-19 LAB — ECHOCARDIOGRAM COMPLETE
Area-P 1/2: 3.37 cm2
S' Lateral: 2.9 cm

## 2022-02-22 ENCOUNTER — Telehealth: Payer: Self-pay

## 2022-02-22 NOTE — Telephone Encounter (Signed)
Patient made aware of her x-ray results.

## 2022-03-05 DIAGNOSIS — G4733 Obstructive sleep apnea (adult) (pediatric): Secondary | ICD-10-CM | POA: Diagnosis not present

## 2022-03-17 ENCOUNTER — Other Ambulatory Visit: Payer: Self-pay | Admitting: Family Medicine

## 2022-03-27 ENCOUNTER — Other Ambulatory Visit: Payer: Self-pay | Admitting: Family Medicine

## 2022-05-26 DIAGNOSIS — F411 Generalized anxiety disorder: Secondary | ICD-10-CM | POA: Diagnosis not present

## 2022-05-26 DIAGNOSIS — N943 Premenstrual tension syndrome: Secondary | ICD-10-CM | POA: Diagnosis not present

## 2022-05-26 DIAGNOSIS — F331 Major depressive disorder, recurrent, moderate: Secondary | ICD-10-CM | POA: Diagnosis not present

## 2022-07-16 DIAGNOSIS — G4733 Obstructive sleep apnea (adult) (pediatric): Secondary | ICD-10-CM | POA: Diagnosis not present

## 2022-07-22 DIAGNOSIS — M545 Low back pain, unspecified: Secondary | ICD-10-CM | POA: Diagnosis not present

## 2022-07-29 DIAGNOSIS — Z79899 Other long term (current) drug therapy: Secondary | ICD-10-CM | POA: Diagnosis not present

## 2022-07-29 DIAGNOSIS — L298 Other pruritus: Secondary | ICD-10-CM | POA: Diagnosis not present

## 2022-07-29 DIAGNOSIS — L309 Dermatitis, unspecified: Secondary | ICD-10-CM | POA: Diagnosis not present

## 2022-07-29 DIAGNOSIS — L813 Cafe au lait spots: Secondary | ICD-10-CM | POA: Diagnosis not present

## 2022-08-02 LAB — LAB REPORT - SCANNED
A1c: 4.8
EGFR: 95

## 2022-08-03 NOTE — Progress Notes (Unsigned)
Acute Office Visit  Subjective:    Patient ID: Karen Clay, female    DOB: 21-May-1955, 67 y.o.   MRN: [id number]  No chief complaint on file.   HPI: Patient is in today for ***  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   Vitamin B deficiency     Past Surgical History:  Procedure Laterality Date   CESAREAN SECTION     REPLACEMENT TOTAL KNEE Right    unicompartmental knee replacement Left     Family History  Problem Relation Age of Onset   Aneurysm Mother    Heart failure Father    CAD Father     Social History   Socioeconomic History   Marital status: Married    Spouse name: [name]   Number of children: 3   Years of education: Not on file   Highest education level: Not on file  Occupational History   Occupation: Self Employeed, pottery painting class  Tobacco Use   Smoking status: Former   Smokeless tobacco: Never  Scientific laboratory technician Use: Never used  Substance and Sexual Activity   Alcohol use: Not Currently    Comment: occasional glass of wine   Drug use: No   Sexual activity: Not on file  Other Topics Concern   Not on file  Social History Narrative   Not on file   Social Determinants of Health   Financial Resource Strain: Low Risk  (02/09/2022)   Overall Financial Resource Strain (CARDIA)    Difficulty of Paying Living Expenses: Not hard at all  Food Insecurity: No Food Insecurity (02/09/2022)   Hunger Vital Sign    Worried About Estate manager/land agent of Food in the Last Year: Never true     Ran Out of Food in the Last Year: Never true  Transportation Needs: Unknown (02/09/2022)   PRAPARE - Hydrologist (Medical): No    Lack of Transportation (Non-Medical): Not on file  Physical Activity: Insufficiently Active (02/09/2022)   Exercise Vital Sign    Days of Exercise per Week: 2 days    Minutes of Exercise per Session: 50 min  Stress: No Stress Concern Present (02/09/2022)   [place]    Feeling of Stress : Not at all  Social Connections: [place] (02/09/2022)   Social Connection and Isolation Panel [NHANES]    Frequency of Communication with Friends and Family: More than three times a week    Frequency of Social Gatherings with Friends and Family: Twice a week    Attends Religious Services: More than 4 times per year    Active Member of [employer] or Organizations: Yes    Attends Archivist Meetings: More than 4 times per year    Marital Status: Married  Human resources officer Violence: Not At Risk (02/09/2022)   Humiliation, Afraid, Rape, and Kick questionnaire    Fear of Current or Ex-Partner: No  Emotionally Abused: No    Physically Abused: No    Sexually Abused: No    Outpatient Medications Prior to Visit  Medication Sig Dispense Refill   buPROPion (WELLBUTRIN XL) 300 MG 24 hr tablet Take 300 mg by mouth daily.      DODEX 1000 MCG/ML injection INJECT 1 ML INTRAMUSCULARLY ONCE A MONTH 3 mL 3   FLUoxetine (PROZAC) 20 MG capsule Take 40 mg by mouth daily.      hydrochlorothiazide (MICROZIDE) 12.5 MG capsule TAKE 1 CAPSULE BY MOUTH DAILY WITH LOSARTAN 90 capsule 3   levothyroxine (SYNTHROID) 75 MCG tablet TAKE 1 TABLET BY MOUTH ONCE DAILY 90 tablet 1   losartan (COZAAR) 50 MG tablet TAKE 1 TABLET BY MOUTH DAILY WITH HCTZ 90 tablet 3   meloxicam (MOBIC) 15 MG tablet Take 1 tablet (15 mg total) by mouth daily. 30 tablet 2   MISC NATURAL PRODUCTS PO Take 1 tablet by  mouth daily. Blood builder supplement from health food store     potassium chloride SA (KLOR-CON M) 20 MEQ tablet TAKE 1 TABLET BY MOUTH DAILY 90 tablet 3   Probiotic Product (PHILLIPS COLON HEALTH PO) Take 1 capsule by mouth daily.     rosuvastatin (CRESTOR) 5 MG tablet TAKE 1 TABLET BY MOUTH DAILY 90 tablet 1   triamcinolone cream (KENALOG) 0.1 % Apply topically 2 (two) times daily as needed.     triamcinolone ointment (KENALOG) 0.1 % Apply 1 application topically 2 (two) times daily. 80 g 1   No facility-administered medications prior to visit.    Allergies  Allergen Reactions   Ace Inhibitors     cough   Niaspan [Niacin]    Zetia [Ezetimibe]     Review of Systems     Objective:        02/09/2022   10:20 AM 09/24/2021   10:03 AM 07/09/2021   10:28 AM  Vitals with BMI  Height 5\' 3"  5\' 3"  5\' 3"   Weight 172 lbs 154 lbs 149 lbs  BMI 30.48 123456 [id number]  Systolic [id number]  99991111  Diastolic 60  64  Pulse 66  60    No data found.   Physical Exam  Health Maintenance Due  Topic Date Due   Diabetic kidney evaluation - Urine ACR  Never done   Hepatitis C Screening  Never done   Zoster Vaccines- Shingrix (2 of 2) 04/05/2018   DEXA SCAN  Never done   Pneumonia Vaccine 71+ Years old (3 of 3 - PPSV23 or PCV20) 08/21/2019   OPHTHALMOLOGY EXAM  03/10/2020   DTaP/Tdap/Td (3 - Td or Tdap) 08/26/2020   FOOT EXAM  07/08/2021   COVID-19 Vaccine (5 - 2023-24 season) 01/08/2022   HEMOGLOBIN A1C  01/09/2022   Diabetic kidney evaluation - eGFR measurement  07/10/2022    There are no preventive care reminders to display for this patient.   Lab Results  Component Value Date   TSH 0.903 07/09/2021   Lab Results  Component Value Date   WBC 6.3 07/09/2021   HGB 13.4 07/09/2021   HCT 39.9 07/09/2021   MCV 89 07/09/2021   PLT 272 07/09/2021   Lab Results  Component Value Date   NA 142 07/09/2021   K 4.4 07/09/2021   CO2 26 07/09/2021   GLUCOSE 98 07/09/2021   BUN 26 07/09/2021    CREATININE 0.71 07/09/2021   BILITOT 0.6 07/09/2021   ALKPHOS 90 07/09/2021   AST 19 07/09/2021   ALT 15 07/09/2021  PROT 7.4 07/09/2021   ALBUMIN 4.9 (H) 07/09/2021   CALCIUM 9.6 07/09/2021   EGFR 93 07/09/2021   Lab Results  Component Value Date   CHOL 173 07/09/2021   Lab Results  Component Value Date   HDL 69 07/09/2021   Lab Results  Component Value Date   LDLCALC 92 07/09/2021   Lab Results  Component Value Date   TRIG 63 07/09/2021   Lab Results  Component Value Date   CHOLHDL 2.5 07/09/2021   Lab Results  Component Value Date   HGBA1C 5.5 07/09/2021       Assessment & Plan:  There are no diagnoses linked to this encounter.   No orders of the defined types were placed in this encounter.   No orders of the defined types were placed in this encounter.    Follow-up: No follow-ups on file.  An After Visit Summary was printed and given to the patient.  Rochel Brome, MD [facility] [phone]

## 2022-08-04 ENCOUNTER — Encounter: Payer: Self-pay | Admitting: Family Medicine

## 2022-08-04 ENCOUNTER — Ambulatory Visit (INDEPENDENT_AMBULATORY_CARE_PROVIDER_SITE_OTHER): Payer: PPO | Admitting: Family Medicine

## 2022-08-04 VITALS — BP 138/72 | HR 82 | Temp 97.2°F | Ht 63.5 in | Wt 180.0 lb

## 2022-08-04 DIAGNOSIS — R0602 Shortness of breath: Secondary | ICD-10-CM | POA: Diagnosis not present

## 2022-08-04 MED ORDER — ALBUTEROL SULFATE (2.5 MG/3ML) 0.083% IN NEBU: 2.5000 mg | INHALATION_SOLUTION | Freq: Once | RESPIRATORY_TRACT | Status: DC

## 2022-08-04 NOTE — Patient Instructions (Signed)
Start on trelegy one inhalation daily.   Get chest xray.

## 2022-08-04 NOTE — Assessment & Plan Note (Addendum)
Peak Flows before and after breathing treatment.  Significant improvement. EKG-NSR CX Xray ordered Start on Trelegy 200s 1 inhalation daily. Follow-up on April 9 for routine checkup and reassess. Discussed history of smoking and get more details at that appointment.  May need a CT scan of chest.

## 2022-08-17 ENCOUNTER — Ambulatory Visit (INDEPENDENT_AMBULATORY_CARE_PROVIDER_SITE_OTHER): Payer: PPO | Admitting: Family Medicine

## 2022-08-17 VITALS — BP 138/64 | HR 60 | Temp 97.1°F | Resp 16 | Ht 63.5 in | Wt 178.4 lb

## 2022-08-17 DIAGNOSIS — I10 Essential (primary) hypertension: Secondary | ICD-10-CM | POA: Diagnosis not present

## 2022-08-17 DIAGNOSIS — D508 Other iron deficiency anemias: Secondary | ICD-10-CM | POA: Diagnosis not present

## 2022-08-17 DIAGNOSIS — M545 Low back pain, unspecified: Secondary | ICD-10-CM | POA: Diagnosis not present

## 2022-08-17 DIAGNOSIS — E538 Deficiency of other specified B group vitamins: Secondary | ICD-10-CM

## 2022-08-17 DIAGNOSIS — E6609 Other obesity due to excess calories: Secondary | ICD-10-CM | POA: Diagnosis not present

## 2022-08-17 DIAGNOSIS — L299 Pruritus, unspecified: Secondary | ICD-10-CM

## 2022-08-17 DIAGNOSIS — Z6830 Body mass index (BMI) 30.0-30.9, adult: Secondary | ICD-10-CM

## 2022-08-17 DIAGNOSIS — E039 Hypothyroidism, unspecified: Secondary | ICD-10-CM

## 2022-08-17 DIAGNOSIS — R7303 Prediabetes: Secondary | ICD-10-CM

## 2022-08-17 DIAGNOSIS — R0602 Shortness of breath: Secondary | ICD-10-CM | POA: Diagnosis not present

## 2022-08-17 DIAGNOSIS — D649 Anemia, unspecified: Secondary | ICD-10-CM

## 2022-08-17 DIAGNOSIS — E782 Mixed hyperlipidemia: Secondary | ICD-10-CM

## 2022-08-17 MED ORDER — HYDROXYZINE PAMOATE 25 MG PO CAPS: 25.0000 mg | ORAL_CAPSULE | Freq: Three times a day (TID) | ORAL | 0 refills | Status: DC | PRN

## 2022-08-17 NOTE — Assessment & Plan Note (Signed)
Continue current treatment.  Labs drawn

## 2022-08-17 NOTE — Patient Instructions (Signed)
Begin vistaril 25 mg every 8 hours as needed for itching.

## 2022-08-17 NOTE — Assessment & Plan Note (Signed)
Continue current treatment. HYDROCHLOROTHIAZIDE 12.5 mg daily. Losartan 50 mg daily,

## 2022-08-17 NOTE — Progress Notes (Unsigned)
Subjective:  Patient ID: Karen Clay, female    DOB: [birth year]  Age: 67 y.o. MRN: [id number]  Chief Complaint  Patient presents with   Hypertension   Hyperlipidemia    HPI Hypertension:  Hydrochlorthiazide 12.5 mg daily,  Losartan 50 mg daily.  Hypothyroidism:  Levothyroxine 75 mcg daily.    Depression:  Bupropion 300 mg daily.  Fluoxetine 20 mg daily. Doing well with current medication  PHQ9 SCORE ONLY 07/09/2021 02/04/2021 01/08/2021  PHQ-9 Total Score 0 0 0   Hyperlipidemia:  Crestor 5 mg daily.   Eating healthy and exercising.   Pruritus:  using kenalog cream as needed, following up with [facility].       08/04/2022    9:24 AM 02/09/2022   10:28 AM 07/09/2021   10:32 AM 02/04/2021   11:34 AM 01/08/2021    9:50 AM  Depression screen PHQ 2/9  Decreased Interest 0 0 0 0 0  Down, Depressed, Hopeless 0 0 0 0 0  PHQ - 2 Score 0 0 0 0 0  Altered sleeping 0 0  0 0  Tired, decreased energy 0 0  0 0  Change in appetite 0 0  0 0  Feeling bad or failure about yourself  0 0  0 0  Trouble concentrating 0 0  0 0  Moving slowly or fidgety/restless 0 0  0 0  Suicidal thoughts 0 0  0 0  PHQ-9 Score 0 0  0 0  Difficult doing work/chores Not difficult at all Not difficult at all  Not difficult at all Not difficult at all         01/08/2021    9:49 AM 02/04/2021   11:41 AM 07/09/2021   10:32 AM 02/09/2022   10:28 AM 08/04/2022    9:23 AM  Fall Risk  Falls in the past year? 0 0 0 1 0  Was there an injury with Fall? 0 0 0 0 0  Fall Risk Category Calculator 0 0 0 1 0  Fall Risk Category (Retired) Low Low Low Low   (RETIRED) Patient Fall Risk Level Low fall risk Low fall risk  Low fall risk   Patient at Risk for Falls Due to No Fall Risks No Fall Risks  No Fall Risks No Fall Risks  Fall risk Follow up Falls evaluation completed Falls evaluation completed Falls evaluation completed Falls evaluation completed Falls evaluation completed      Review of Systems   Constitutional:  Negative for chills, fatigue and fever.  HENT:  Negative for congestion, rhinorrhea and sore throat.   Respiratory:  Positive for shortness of breath. Negative for cough.   Cardiovascular:  Negative for chest pain.  Gastrointestinal:  Negative for abdominal pain, constipation, diarrhea, nausea and vomiting.  Genitourinary:  Negative for dysuria and urgency.  Musculoskeletal:  Positive for back pain. Negative for myalgias.  Skin:  Positive for rash.  Neurological:  Negative for dizziness, weakness, light-headedness and headaches.  Psychiatric/Behavioral:  Negative for dysphoric mood. The patient is not nervous/anxious.     Current Outpatient Medications on File Prior to Visit  Medication Sig Dispense Refill   buPROPion (WELLBUTRIN XL) 300 MG 24 hr tablet Take 300 mg by mouth daily.      diclofenac (VOLTAREN) 50 MG EC tablet Take 50 mg by mouth 2 (two) times daily.     DODEX 1000 MCG/ML injection INJECT 1 ML INTRAMUSCULARLY ONCE A MONTH 3 mL 3   FLUoxetine (PROZAC) 20 MG capsule Take  40 mg by mouth daily.      hydrochlorothiazide (MICROZIDE) 12.5 MG capsule TAKE 1 CAPSULE BY MOUTH DAILY WITH LOSARTAN 90 capsule 3   levothyroxine (SYNTHROID) 75 MCG tablet TAKE 1 TABLET BY MOUTH ONCE DAILY 90 tablet 1   losartan (COZAAR) 50 MG tablet TAKE 1 TABLET BY MOUTH DAILY WITH HCTZ 90 tablet 3   meloxicam (MOBIC) 15 MG tablet Take 1 tablet (15 mg total) by mouth daily. 30 tablet 2   MISC NATURAL PRODUCTS PO Take 1 tablet by mouth daily. Blood builder supplement from health food store     potassium chloride SA (KLOR-CON M) 20 MEQ tablet TAKE 1 TABLET BY MOUTH DAILY 90 tablet 3   rosuvastatin (CRESTOR) 5 MG tablet TAKE 1 TABLET BY MOUTH DAILY 90 tablet 1   triamcinolone cream (KENALOG) 0.1 % Apply topically 2 (two) times daily as needed.     triamcinolone ointment (KENALOG) 0.1 % Apply 1 application topically 2 (two) times daily. 80 g 1   Current Facility-Administered Medications on  File Prior to Visit  Medication Dose Route Frequency Provider Last Rate Last Admin   albuterol (PROVENTIL) (2.5 MG/3ML) 0.083% nebulizer solution 2.5 mg  2.5 mg Nebulization Once Viona Hosking, Fritzi MandesKirsten, MD       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   Vitamin B deficiency    Past Surgical History:  Procedure Laterality Date   CESAREAN SECTION     REPLACEMENT TOTAL KNEE Right    unicompartmental knee replacement Left     Family History  Problem Relation Age of Onset   Aneurysm Mother    Heart failure Father    CAD Father    Social History   Socioeconomic History   Marital status: Married    Spouse name: Alden Serverrnest   Number of children: 3   Years of education: Not on file   Highest education level: Not on file  Occupational History   Occupation: Self Employeed, pottery painting class  Tobacco Use   Smoking status: Former    Types: Cigarettes   Smokeless tobacco: Never  Vaping Use   Vaping Use: Never used  Substance and Sexual Activity   Alcohol use: Not Currently    Comment: occasional glass of wine   Drug use: No   Sexual activity: Not on file  Other Topics Concern   Not on file  Social History Narrative   Not on file   Social Determinants of Health   Financial Resource Strain: Low Risk  (02/09/2022)   Overall Financial Resource Strain (CARDIA)    Difficulty of Paying Living Expenses: Not hard at all  Food Insecurity: No Food Insecurity (02/09/2022)   Hunger Vital Sign    Worried About Radiation protection practitionerunning Out of Food in the Last  Year: Never true    Ran Out of Food in the Last Year: Never true  Transportation Needs: No Transportation Needs (08/04/2022)   PRAPARE - Administrator, Civil ServiceTransportation    Lack of Transportation (Medical): No  Lack of Transportation (Non-Medical): No  Physical Activity: Insufficiently Active (02/09/2022)   Exercise Vital Sign    Days of Exercise per Week: 2 days    Minutes of Exercise per Session: 50 min  Stress: No Stress Concern Present (02/09/2022)   [employer] of Occupational Health - Occupational Stress Questionnaire    Feeling of Stress : Not at all  Social Connections: Socially Integrated (02/09/2022)   Social Connection and Isolation Panel [NHANES]    Frequency of Communication with Friends and Family: More than three times a week    Frequency of Social Gatherings with Friends and Family: Twice a week    Attends Religious Services: More than 4 times per year    Active Member of [employer] or Organizations: Yes    Attends Engineer, structural: More than 4 times per year    Marital Status: Married    Objective:  BP 138/64   Pulse 60   Temp (!) 97.1 F (36.2 C)   Resp 16   Ht 5' 3.5" (1.613 m)   Wt 178 lb 6.4 oz (80.9 kg)   BMI 31.11 kg/m      08/17/2022   10:14 AM 08/04/2022    9:18 AM 02/09/2022   10:20 AM  BP/Weight  Systolic BP 138 138 124  Diastolic BP 64 72 60  Wt. (Lbs) 178.4 180 172  BMI 31.11 kg/m2 31.39 kg/m2 30.47 kg/m2    Physical Exam Vitals reviewed.  Constitutional:      Appearance: Normal appearance. She is normal weight.  Neck:     Vascular: No carotid bruit.  Cardiovascular:     Rate and Rhythm: Normal rate and regular rhythm.     Heart sounds: Normal heart sounds.  Pulmonary:     Effort: Pulmonary effort is normal. No respiratory distress.     Breath sounds: Normal breath sounds.  Abdominal:     General: Abdomen is flat. Bowel sounds are normal.     Palpations: Abdomen is soft.     Tenderness: There is no abdominal tenderness.  Skin:     Findings: Rash (scabbed lesions on forearms) present.  Neurological:     Mental Status: She is alert and oriented to person, place, and time.  Psychiatric:        Mood and Affect: Mood normal.        Behavior: Behavior normal.     Diabetic Foot Exam - Simple   No data filed      Lab Results  Component Value Date   WBC 6.3 07/09/2021   HGB 13.4 07/09/2021   HCT 39.9 07/09/2021   PLT 272 07/09/2021   GLUCOSE 98 07/09/2021   CHOL 173 07/09/2021   TRIG 63 07/09/2021   HDL 69 07/09/2021   LDLCALC 92 07/09/2021   ALT 15 07/09/2021   AST 19 07/09/2021   NA 142 07/09/2021   K 4.4 07/09/2021   CL 103 07/09/2021   CREATININE 0.71 07/09/2021   BUN 26 07/09/2021   CO2 26 07/09/2021   TSH 0.903 07/09/2021   HGBA1C 5.5 07/09/2021      Assessment & Plan:    B12 deficiency Assessment & Plan: Continue current treatment.  Labs drawn   Orders: -     B12 and Folate Panel -     Methylmalonic acid, serum  Mixed hyperlipidemia Assessment & Plan: Low fat diet. Crestor 5 mg daily.    Essential hypertension Assessment & Plan: Continue current treatment. HYDROCHLOROTHIAZIDE 12.5 mg daily. Losartan 50 mg  daily,    Orders: -     CBC with Differential/Platelet -     Comprehensive metabolic panel  Pain in left lumbar region of back Assessment & Plan: Recommend stretching.   Prediabetes Assessment & Plan: Recommend continue to work on eating healthy diet and exercise.    Shortness of breath Assessment & Plan: Concerning for asthma. Trelegy helped, but she did not like how it tasted.  Her breathing improved.  Recommend go off trelegy and call back if breathing worsens.  At which time, I would recommend trying breztri 2 puffs twice daily.   Orders: -     Peak flow  Acquired hypothyroidism Assessment & Plan: Previously well controlled Continue Synthroid at current dose  Recheck TSH and adjust Synthroid as indicated    Orders: -      TSH  Pruritus Assessment & Plan: Recommend hydroxyzine 25 mg three times a day as needed   Orders: -     hydrOXYzine Pamoate; Take 1 capsule (25 mg total) by mouth every 8 (eight) hours as needed.  Dispense: 90 capsule; Refill: 0  Other iron deficiency anemia -     Iron, TIBC and Ferritin Panel  Class 1 obesity due to excess calories with serious comorbidity and body mass index (BMI) of 30.0 to 30.9 in adult Assessment & Plan: Recommend continue to work on eating healthy diet and exercise.       Meds ordered this encounter  Medications   hydrOXYzine (VISTARIL) 25 MG capsule    Sig: Take 1 capsule (25 mg total) by mouth every 8 (eight) hours as needed.    Dispense:  90 capsule    Refill:  0    Orders Placed This Encounter  Procedures   B12 and Folate Panel   Methylmalonic acid, serum   CBC with Differential/Platelet   Comprehensive metabolic panel   TSH   Iron, TIBC and Ferritin Panel   Peak flow     Follow-up: Return in about 6 months (around 02/16/2023) for chronic fasting.  Total time spent on today's visit was greater than 30 minutes, including both face-to-face time and nonface-to-face time personally spent on review of chart (labs and imaging), discussing labs and goals, discussing further work-up, treatment options, referrals to specialist if needed, reviewing outside records of pertinent, answering patient's questions, and coordinating care.  I,Carolyn M Morrison,acting as a Neurosurgeon for Blane Ohara, MD.,have documented all relevant documentation on the behalf of Blane Ohara, MD,as directed by  Blane Ohara, MD while in the presence of Blane Ohara, MD.   An After Visit Summary was printed and given to the patient.  Blane Ohara, MD [facility] [phone]

## 2022-08-17 NOTE — Assessment & Plan Note (Signed)
Low fat diet. Crestor 5 mg daily.

## 2022-08-18 ENCOUNTER — Encounter: Payer: Self-pay | Admitting: Family Medicine

## 2022-08-18 DIAGNOSIS — D649 Anemia, unspecified: Secondary | ICD-10-CM | POA: Insufficient documentation

## 2022-08-18 DIAGNOSIS — E039 Hypothyroidism, unspecified: Secondary | ICD-10-CM | POA: Insufficient documentation

## 2022-08-18 DIAGNOSIS — L299 Pruritus, unspecified: Secondary | ICD-10-CM | POA: Insufficient documentation

## 2022-08-18 NOTE — Assessment & Plan Note (Signed)
Recommend hydroxyzine 25 mg three times a day as needed

## 2022-08-18 NOTE — Assessment & Plan Note (Signed)
Recommend stretching.

## 2022-08-18 NOTE — Assessment & Plan Note (Signed)
Recommend continue to work on eating healthy diet and exercise.  

## 2022-08-18 NOTE — Assessment & Plan Note (Signed)
Previously well controlled Continue Synthroid at current dose  Recheck TSH and adjust Synthroid as indicated   

## 2022-08-18 NOTE — Assessment & Plan Note (Signed)
Concerning for asthma. Trelegy helped, but she did not like how it tasted.  Her breathing improved.  Recommend go off trelegy and call back if breathing worsens.  At which time, I would recommend trying breztri 2 puffs twice daily.

## 2022-08-23 LAB — CBC WITH DIFFERENTIAL/PLATELET
Basophils Absolute: 0.1 10*3/uL (ref 0.0–0.2)
Basos: 1 %
EOS (ABSOLUTE): 0.5 10*3/uL — ABNORMAL HIGH (ref 0.0–0.4)
Eos: 8 %
Hematocrit: 35.7 % (ref 34.0–46.6)
Hemoglobin: 11.7 g/dL (ref 11.1–15.9)
Immature Grans (Abs): 0.1 10*3/uL (ref 0.0–0.1)
Immature Granulocytes: 1 %
Lymphocytes Absolute: 1 10*3/uL (ref 0.7–3.1)
Lymphs: 14 %
MCH: 32.1 pg (ref 26.6–33.0)
MCHC: 32.8 g/dL (ref 31.5–35.7)
MCV: 98 fL — ABNORMAL HIGH (ref 79–97)
Monocytes Absolute: 0.6 10*3/uL (ref 0.1–0.9)
Monocytes: 8 %
Neutrophils Absolute: 4.7 10*3/uL (ref 1.4–7.0)
Neutrophils: 68 %
Platelets: 261 10*3/uL (ref 150–450)
RBC: 3.64 x10E6/uL — ABNORMAL LOW (ref 3.77–5.28)
RDW: 11.9 % (ref 11.7–15.4)
WBC: 6.9 10*3/uL (ref 3.4–10.8)

## 2022-08-23 LAB — METHYLMALONIC ACID, SERUM: Methylmalonic Acid: 115 nmol/L (ref 0–378)

## 2022-08-23 LAB — IRON,TIBC AND FERRITIN PANEL
Ferritin: 37 ng/mL (ref 15–150)
Iron Saturation: 15 % (ref 15–55)
Iron: 62 ug/dL (ref 27–139)
Total Iron Binding Capacity: 407 ug/dL (ref 250–450)
UIBC: 345 ug/dL (ref 118–369)

## 2022-08-23 LAB — COMPREHENSIVE METABOLIC PANEL
ALT: 38 IU/L — ABNORMAL HIGH (ref 0–32)
AST: 29 IU/L (ref 0–40)
Albumin/Globulin Ratio: 1.8 (ref 1.2–2.2)
Albumin: 4.9 g/dL (ref 3.9–4.9)
Alkaline Phosphatase: 86 IU/L (ref 44–121)
BUN/Creatinine Ratio: 42 — ABNORMAL HIGH (ref 12–28)
BUN: 31 mg/dL — ABNORMAL HIGH (ref 8–27)
Bilirubin Total: <0.2 mg/dL (ref 0.0–1.2)
CO2: 20 mmol/L (ref 20–29)
Calcium: 9.7 mg/dL (ref 8.7–10.3)
Chloride: 102 mmol/L (ref 96–106)
Creatinine, Ser: 0.74 mg/dL (ref 0.57–1.00)
Globulin, Total: 2.7 g/dL (ref 1.5–4.5)
Glucose: 110 mg/dL — ABNORMAL HIGH (ref 70–99)
Potassium: 4.6 mmol/L (ref 3.5–5.2)
Sodium: 140 mmol/L (ref 134–144)
Total Protein: 7.6 g/dL (ref 6.0–8.5)
eGFR: 88 mL/min/{1.73_m2} (ref >59)

## 2022-08-23 LAB — B12 AND FOLATE PANEL
Folate: >20 ng/mL (ref >3.0)
Vitamin B-12: 663 pg/mL (ref 232–1245)

## 2022-08-23 LAB — TSH: TSH: 1.36 u[IU]/mL (ref 0.450–4.500)

## 2022-09-13 ENCOUNTER — Other Ambulatory Visit: Payer: Self-pay | Admitting: Family Medicine

## 2022-09-15 ENCOUNTER — Telehealth: Payer: Self-pay | Admitting: Gastroenterology

## 2022-09-15 NOTE — Telephone Encounter (Signed)
Hi Dr. Lavon Paganini,  Supervising Provider: 09/15/22-AM  We received a referral for patient to be evaluated for chronic pruritus, anemia with elevated BUN and liver enzymes. She does have GI history with Dr. Braulio Conte who has retired. Her records were obatained and scanned into [employer] for yo to review and advise on scheduling.    Thanks

## 2022-09-15 NOTE — Telephone Encounter (Signed)
Request received to transfer GI care from outside practice to [facility].  We appreciate the interest in our practice, however at this time due to high demand from patients without established GI providers we cannot accommodate this transfer.      

## 2022-09-21 NOTE — Telephone Encounter (Signed)
Called patient to advise left voicemail. °

## 2022-09-22 DIAGNOSIS — B9689 Other specified bacterial agents as the cause of diseases classified elsewhere: Secondary | ICD-10-CM | POA: Diagnosis not present

## 2022-09-22 DIAGNOSIS — J019 Acute sinusitis, unspecified: Secondary | ICD-10-CM | POA: Diagnosis not present

## 2022-09-27 NOTE — Telephone Encounter (Signed)
Patient called regarding transfer of care request. Advise her of request.

## 2022-09-28 DIAGNOSIS — F331 Major depressive disorder, recurrent, moderate: Secondary | ICD-10-CM | POA: Diagnosis not present

## 2022-09-28 DIAGNOSIS — F411 Generalized anxiety disorder: Secondary | ICD-10-CM | POA: Diagnosis not present

## 2022-09-28 DIAGNOSIS — N943 Premenstrual tension syndrome: Secondary | ICD-10-CM | POA: Diagnosis not present

## 2022-09-29 ENCOUNTER — Other Ambulatory Visit: Payer: Self-pay | Admitting: Family Medicine

## 2022-10-21 DIAGNOSIS — G4733 Obstructive sleep apnea (adult) (pediatric): Secondary | ICD-10-CM | POA: Diagnosis not present

## 2022-11-10 ENCOUNTER — Other Ambulatory Visit: Payer: Self-pay | Admitting: Family Medicine

## 2022-11-10 ENCOUNTER — Telehealth: Payer: Self-pay

## 2022-11-10 MED ORDER — SCOPOLAMINE 1 MG/3DAYS TD PT72: 1.0000 | MEDICATED_PATCH | TRANSDERMAL | 0 refills | Status: DC

## 2022-11-10 NOTE — Telephone Encounter (Signed)
Patient called and stated she is going on a trip and wanted to know if provider could call her in some sea sick patches. Please advise

## 2022-11-10 NOTE — Telephone Encounter (Signed)
Called and left detail message for patient to call the office with any questions

## 2022-11-15 ENCOUNTER — Other Ambulatory Visit: Payer: Self-pay | Admitting: Family Medicine

## 2022-11-16 DIAGNOSIS — Z1231 Encounter for screening mammogram for malignant neoplasm of breast: Secondary | ICD-10-CM | POA: Diagnosis not present

## 2022-11-16 LAB — HM MAMMOGRAPHY

## 2022-12-14 ENCOUNTER — Other Ambulatory Visit: Payer: Self-pay | Admitting: Family Medicine

## 2023-01-19 DIAGNOSIS — F411 Generalized anxiety disorder: Secondary | ICD-10-CM | POA: Diagnosis not present

## 2023-01-19 DIAGNOSIS — F331 Major depressive disorder, recurrent, moderate: Secondary | ICD-10-CM | POA: Diagnosis not present

## 2023-01-19 DIAGNOSIS — N943 Premenstrual tension syndrome: Secondary | ICD-10-CM | POA: Diagnosis not present

## 2023-02-08 ENCOUNTER — Other Ambulatory Visit: Payer: Self-pay | Admitting: Family Medicine

## 2023-02-15 NOTE — Assessment & Plan Note (Addendum)
Previously well controlled Continue Synthroid at current dose

## 2023-02-15 NOTE — Assessment & Plan Note (Signed)
Continue current treatment. HYDROCHLOROTHIAZIDE 12.5 mg daily. Losartan 50 mg daily,

## 2023-02-15 NOTE — Progress Notes (Addendum)
 Subjective:  Patient ID: Karen Clay, female    DOB: 01-02-1956  Age: 67 y.o. MRN: [id number]  Chief Complaint  Patient presents with   Medical Management of Chronic Issues    HPI Hypertension:  Hydrochlorthiazide 12.5 mg daily,  Losartan 50 mg daily.  Hypothyroidism:  Levothyroxine 75 mcg daily.    Depression:  Bupropion XL 300 mg daily.  Fluoxetine 20 mg daily. Doing well with current medication   Hyperlipidemia:  Crestor 5 mg daily.   Eating healthy and exercising.   Osteoarthritis of lumbar spine.  Patient saw [facility].  They gave her diclofenac pills.  He is upset her stomach.  She is also tried meloxicam, Aleve, and ibuprofen all of which also upset her stomach.  She has tried Tylenol in the past which has not really help with pain.  Patient also has a right index trigger finger.  It is only mild and she does not wish to get an injection today.  She may call back for 1 if needed.  Postnasal drainage.  This been going on for 1 to 2 months.  She is taking an over-the-counter allergy medicine which really has not helped.  She does have a nasal spray which she gets from [employer], but she is not sure what the name of it is.  She is going to call us back to let us know.  She does have some sinus tenderness.     05/31/2023    2:29 PM 02/16/2023   10:29 AM 08/04/2022    9:24 AM 02/09/2022   10:28 AM 07/09/2021   10:32 AM  Depression screen PHQ 2/9  Decreased Interest 0 0 0 0 0  Down, Depressed, Hopeless 0 0 0 0 0  PHQ - 2 Score 0 0 0 0 0  Altered sleeping 0 0 0 0   Tired, decreased energy 0 0 0 0   Change in appetite 0 0 0 0   Feeling bad or failure about yourself  0 0 0 0   Trouble concentrating 0 0 0 0   Moving slowly or fidgety/restless 0 0 0 0   Suicidal thoughts 0 0 0 0   PHQ-9 Score 0 0 0 0   Difficult doing work/chores Not difficult at all Not difficult at all Not difficult at all Not difficult at all         05/31/2023    2:27 PM  Fall Risk   Falls  in the past year? 0  Number falls in past yr: 0  Injury with Fall? 0  Risk for fall due to : No Fall Risks  Follow up Falls prevention discussed    Patient Care Team: Blane Ohara, MD as PCP - General (Family Medicine) Misenheimer, Marcial Pacas, MD as Consulting Physician (Unknown Physician Specialty) Earvin Hansen, [facility] (Inactive) as Pharmacist (Pharmacist) Kern Reap, MD as Consulting Physician (Obstetrics and Gynecology) Birdie Sons, OD as Consulting Physician (Ophthalmology)   Review of Systems  Constitutional:  Negative for appetite change, fatigue and fever.  HENT:  Positive for congestion (Drainage causing cough). Negative for ear pain, sinus pressure and sore throat.   Respiratory:  Negative for cough, chest tightness, shortness of breath and wheezing.   Cardiovascular:  Negative for chest pain and palpitations.  Gastrointestinal:  Negative for abdominal pain, constipation, diarrhea, nausea and vomiting.  Genitourinary:  Negative for dysuria and hematuria.  Musculoskeletal:  Positive for myalgias (Right pointer finger). Negative for arthralgias, back pain and joint swelling.  Skin:  Negative for rash.  Neurological:  Negative for dizziness, weakness and headaches.  Psychiatric/Behavioral:  Negative for dysphoric mood. The patient is not nervous/anxious.     Current Outpatient Medications on File Prior to Visit  Medication Sig Dispense Refill   buPROPion (WELLBUTRIN XL) 300 MG 24 hr tablet Take 300 mg by mouth daily.      FLUoxetine (PROZAC) 20 MG capsule TAKE 1 CAPSULE BY MOUTH TWICE DAILY 180 capsule 2   hydrochlorothiazide (MICROZIDE) 12.5 MG capsule TAKE 1 CAPSULE BY MOUTH DAILY WITH LOSARTAN (Patient not taking: Reported on 04/26/2023) 90 capsule 1   losartan (COZAAR) 50 MG tablet TAKE 1 TABLET BY MOUTH DAILY WITH HCTZ 90 tablet 1   MISC NATURAL PRODUCTS PO Take 1 tablet by mouth daily. Blood builder supplement from health food store     triamcinolone ointment  (KENALOG) 0.1 % Apply 1 application topically 2 (two) times daily. 80 g 1   No current facility-administered medications on file prior to visit.   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   Vitamin B deficiency    Past Surgical History:  Procedure Laterality Date   CESAREAN SECTION     REPLACEMENT TOTAL KNEE Right    unicompartmental knee replacement Left     Family History  Problem Relation Age of Onset   Aneurysm Mother    Heart failure Father    CAD Father    Social History   Socioeconomic History   Marital status: Married    Spouse name: Alden Server   Number of children: 3   Years of education: Not on file   Highest education level: Not on file  Occupational History   Occupation: Self Employeed, pottery painting class  Tobacco Use   Smoking status: Former    Types: Cigarettes    Passive exposure: Never   Smokeless tobacco: Never  Vaping Use   Vaping status: Never Used  Substance and Sexual Activity   Alcohol use: Not Currently    Comment: occasional glass of wine   Drug use: No   Sexual activity: Not on file  Other Topics Concern   Not on file  Social History Narrative   Not on file   Social Drivers of Health   Financial Resource Strain: Low Risk  (05/31/2023)   Overall Financial Resource Strain (CARDIA)    Difficulty of Paying Living Expenses: Not hard at all  Food Insecurity: No Food Insecurity (05/31/2023)   Hunger Vital Sign    Worried About Running Out of Food in the Last Year: Never true    Ran Out of  Food in the Last Year: Never true  Transportation Needs: No Transportation Needs (05/31/2023)   PRAPARE - Administrator, Civil Service (Medical): No    Lack of Transportation (Non-Medical): No  Physical Activity: Sufficiently Active (05/31/2023)   Exercise Vital Sign    Days of Exercise per Week: 5 days    Minutes of Exercise per Session: 50 min  Stress:  No Stress Concern Present (05/31/2023)   [employer] of Occupational Health - Occupational Stress Questionnaire    Feeling of Stress : Not at all  Social Connections: Socially Integrated (05/31/2023)   Social Connection and Isolation Panel [NHANES]    Frequency of Communication with Friends and Family: More than three times a week    Frequency of Social Gatherings with Friends and Family: Twice a week    Attends Religious Services: More than 4 times per year    Active Member of [employer] or Organizations: Yes    Attends Engineer, structural: More than 4 times per year    Marital Status: Married    Objective:  BP 138/72 (BP Location: Left Arm, Patient Position: Sitting)   Pulse 71   Temp 98.2 F (36.8 C) (Oral)   Ht 5' 3.5" (1.613 m)   Wt 180 lb 12.8 oz (82 kg)   SpO2 96%   BMI 31.52 kg/m      05/31/2023    2:24 PM 04/26/2023    2:41 PM 02/16/2023   10:27 AM  BP/Weight  Systolic BP  138 161  Diastolic BP  72 72  Wt. (Lbs) 179 183.8 180.8  BMI 31.21 kg/m2 32.05 kg/m2 31.52 kg/m2    Physical Exam Vitals reviewed.  Constitutional:      Appearance: Normal appearance.  HENT:     Right Ear: Tympanic membrane, ear canal and external ear normal.     Left Ear: Tympanic membrane, ear canal and external ear normal.     Nose: Congestion present.     Right Turbinates: Enlarged and swollen.     Left Turbinates: Enlarged and swollen.     Right Sinus: Maxillary sinus tenderness present.     Left Sinus: Maxillary sinus tenderness present.     Mouth/Throat:     Pharynx: Oropharynx is clear.  Cardiovascular:      Rate and Rhythm: Normal rate and regular rhythm.     Heart sounds: Normal heart sounds. No murmur heard. Pulmonary:     Effort: Pulmonary effort is normal. No respiratory distress.     Breath sounds: Normal breath sounds.  Musculoskeletal:     Comments: RIGHT INDEX FINGER MILD TRIGGERING.   Lymphadenopathy:     Cervical: No cervical adenopathy.  Neurological:     Mental Status: She is alert and oriented to person, place, and time.  Psychiatric:        Mood and Affect: Mood normal.        Behavior: Behavior normal.     Diabetic Foot Exam - Simple   No data filed      Lab Results  Component Value Date   WBC 8.0 02/16/2023   HGB 13.0 02/16/2023   HCT 40.1 02/16/2023   PLT 226 02/16/2023   GLUCOSE 103 (H) 02/17/2023   CHOL 177 02/16/2023   TRIG 142 02/16/2023   HDL 59 02/16/2023   LDLCALC 93 02/16/2023   ALT 41 (H) 02/17/2023   AST 31 02/17/2023   NA 140 02/17/2023   K 4.5 02/17/2023   CL 101 02/17/2023   CREATININE 0.74 02/17/2023   BUN 32 (H) 02/17/2023   CO2 20 02/17/2023   TSH 1.360 08/17/2022   HGBA1C 5.5 07/09/2021      Assessment & Plan:    Essential hypertension Assessment & Plan: Continue current treatment. HYDROCHLOROTHIAZIDE 12.5 mg daily. Losartan 50 mg daily,    Orders: -     CBC with Differential/Platelet -     Comprehensive  metabolic panel  Acquired hypothyroidism Assessment & Plan: Previously well controlled Continue Synthroid at current dose     Mixed hyperlipidemia Assessment & Plan: Low fat diet. Crestor 5 mg daily.   Orders: -     Lipid panel  Mild recurrent major depression (HCC) Assessment & Plan: Continue Prozac and Wellbutrin.  Well-controlled    Prediabetes Assessment & Plan: Recommend continue to work on eating healthy diet and exercise.    Acute non-recurrent sinusitis of other sinus Assessment & Plan: Sent cefdimir.   Trigger index finger of right hand Assessment & Plan: May return for trigger  finger injection if worsens.   Encounter for immunization -     Flu Vaccine Trivalent High Dose (Fluad)  Other osteoarthritis of spine, lumbar region Assessment & Plan: Sent Celebrex.  Orders: -     Celecoxib; Take 1 capsule (200 mg total) by mouth daily.  Dispense: 30 capsule; Refill: 3  OSA (obstructive sleep apnea) Assessment & Plan: CPAP older than 5 years, patient still benefits from the machine, needs replacement.      Meds ordered this encounter  Medications   DISCONTD: cefdinir (OMNICEF) 300 MG capsule    Sig: Take 1 capsule (300 mg total) by mouth 2 (two) times daily.    Dispense:  20 capsule    Refill:  0   celecoxib (CELEBREX) 200 MG capsule    Sig: Take 1 capsule (200 mg total) by mouth daily.    Dispense:  30 capsule    Refill:  3    Orders Placed This Encounter  Procedures   Flu Vaccine Trivalent High Dose (Fluad)   CBC with Differential/Platelet   Comprehensive metabolic panel   Lipid panel     Follow-up: Return in about 6 months (around 08/17/2023) for chronic fasting.   I,Marla I Leal-Borjas,acting as a scribe for Blane Ohara, MD.,have documented all relevant documentation on the behalf of Blane Ohara, MD,as directed by  Blane Ohara, MD while in the presence of Blane Ohara, MD.   An After Visit Summary was printed and given to the patient.  I attest that I have reviewed this visit and agree with the plan scribed by my staff.   Blane Ohara, MD [facility] [phone]

## 2023-02-15 NOTE — Assessment & Plan Note (Signed)
Low fat diet. Crestor 5 mg daily.

## 2023-02-15 NOTE — Assessment & Plan Note (Signed)
Continue Prozac and Wellbutrin.  Well-controlled

## 2023-02-15 NOTE — Assessment & Plan Note (Signed)
Recommend continue to work on eating healthy diet and exercise.  

## 2023-02-16 ENCOUNTER — Ambulatory Visit: Payer: PPO | Admitting: Family Medicine

## 2023-02-16 ENCOUNTER — Encounter: Payer: Self-pay | Admitting: Family Medicine

## 2023-02-16 VITALS — BP 138/72 | HR 71 | Temp 98.2°F | Ht 63.5 in | Wt 180.8 lb

## 2023-02-16 DIAGNOSIS — E039 Hypothyroidism, unspecified: Secondary | ICD-10-CM | POA: Diagnosis not present

## 2023-02-16 DIAGNOSIS — R7303 Prediabetes: Secondary | ICD-10-CM

## 2023-02-16 DIAGNOSIS — Z23 Encounter for immunization: Secondary | ICD-10-CM | POA: Diagnosis not present

## 2023-02-16 DIAGNOSIS — I10 Essential (primary) hypertension: Secondary | ICD-10-CM | POA: Diagnosis not present

## 2023-02-16 DIAGNOSIS — J018 Other acute sinusitis: Secondary | ICD-10-CM | POA: Diagnosis not present

## 2023-02-16 DIAGNOSIS — G4733 Obstructive sleep apnea (adult) (pediatric): Secondary | ICD-10-CM

## 2023-02-16 DIAGNOSIS — M47896 Other spondylosis, lumbar region: Secondary | ICD-10-CM | POA: Diagnosis not present

## 2023-02-16 DIAGNOSIS — F330 Major depressive disorder, recurrent, mild: Secondary | ICD-10-CM | POA: Diagnosis not present

## 2023-02-16 DIAGNOSIS — M65321 Trigger finger, right index finger: Secondary | ICD-10-CM | POA: Diagnosis not present

## 2023-02-16 DIAGNOSIS — E782 Mixed hyperlipidemia: Secondary | ICD-10-CM | POA: Diagnosis not present

## 2023-02-16 MED ORDER — CELECOXIB 200 MG PO CAPS
200.0000 mg | ORAL_CAPSULE | Freq: Every day | ORAL | 3 refills | Status: AC
Start: 2023-02-16 — End: ?

## 2023-02-16 MED ORDER — CEFDINIR 300 MG PO CAPS: 300.0000 mg | ORAL_CAPSULE | Freq: Two times a day (BID) | ORAL | 0 refills | Status: DC

## 2023-02-17 ENCOUNTER — Other Ambulatory Visit: Payer: PPO

## 2023-02-17 ENCOUNTER — Other Ambulatory Visit: Payer: Self-pay

## 2023-02-17 ENCOUNTER — Other Ambulatory Visit: Payer: Self-pay | Admitting: Family Medicine

## 2023-02-17 DIAGNOSIS — E875 Hyperkalemia: Secondary | ICD-10-CM | POA: Diagnosis not present

## 2023-02-17 LAB — CBC WITH DIFFERENTIAL/PLATELET
Basophils Absolute: 0.1 10*3/uL (ref 0.0–0.2)
Basos: 2 %
EOS (ABSOLUTE): 0.8 10*3/uL — ABNORMAL HIGH (ref 0.0–0.4)
Eos: 10 %
Hematocrit: 40.1 % (ref 34.0–46.6)
Hemoglobin: 13 g/dL (ref 11.1–15.9)
Immature Grans (Abs): 0.1 10*3/uL (ref 0.0–0.1)
Immature Granulocytes: 2 %
Lymphocytes Absolute: 1.1 10*3/uL (ref 0.7–3.1)
Lymphs: 14 %
MCH: 32.3 pg (ref 26.6–33.0)
MCHC: 32.4 g/dL (ref 31.5–35.7)
MCV: 100 fL — ABNORMAL HIGH (ref 79–97)
Monocytes Absolute: 0.7 10*3/uL (ref 0.1–0.9)
Monocytes: 9 %
Neutrophils Absolute: 5.1 10*3/uL (ref 1.4–7.0)
Neutrophils: 63 %
Platelets: 226 10*3/uL (ref 150–450)
RBC: 4.02 x10E6/uL (ref 3.77–5.28)
RDW: 11.8 % (ref 11.7–15.4)
WBC: 8 10*3/uL (ref 3.4–10.8)

## 2023-02-17 LAB — COMPREHENSIVE METABOLIC PANEL
ALT: 44 [IU]/L — ABNORMAL HIGH (ref 0–32)
AST: 34 [IU]/L (ref 0–40)
Albumin: 5.1 g/dL — ABNORMAL HIGH (ref 3.9–4.9)
Alkaline Phosphatase: 85 [IU]/L (ref 44–121)
BUN/Creatinine Ratio: 39 — ABNORMAL HIGH (ref 12–28)
BUN: 29 mg/dL — ABNORMAL HIGH (ref 8–27)
Bilirubin Total: 0.5 mg/dL (ref 0.0–1.2)
CO2: 22 mmol/L (ref 20–29)
Calcium: 10.1 mg/dL (ref 8.7–10.3)
Chloride: 101 mmol/L (ref 96–106)
Creatinine, Ser: 0.74 mg/dL (ref 0.57–1.00)
Globulin, Total: 2.7 g/dL (ref 1.5–4.5)
Glucose: 109 mg/dL — ABNORMAL HIGH (ref 70–99)
Potassium: 5.5 mmol/L — ABNORMAL HIGH (ref 3.5–5.2)
Sodium: 142 mmol/L (ref 134–144)
Total Protein: 7.8 g/dL (ref 6.0–8.5)
eGFR: 88 mL/min/{1.73_m2} (ref >59)

## 2023-02-17 LAB — LIPID PANEL
Chol/HDL Ratio: 3 {ratio} (ref 0.0–4.4)
Cholesterol, Total: 177 mg/dL (ref 100–199)
HDL: 59 mg/dL (ref >39)
LDL Chol Calc (NIH): 93 mg/dL (ref 0–99)
Triglycerides: 142 mg/dL (ref 0–149)
VLDL Cholesterol Cal: 25 mg/dL (ref 5–40)

## 2023-02-18 LAB — CMP14+EGFR
ALT: 41 [IU]/L — ABNORMAL HIGH (ref 0–32)
AST: 31 [IU]/L (ref 0–40)
Albumin: 4.8 g/dL (ref 3.9–4.9)
Alkaline Phosphatase: 83 [IU]/L (ref 44–121)
BUN/Creatinine Ratio: 43 — ABNORMAL HIGH (ref 12–28)
BUN: 32 mg/dL — ABNORMAL HIGH (ref 8–27)
Bilirubin Total: 0.3 mg/dL (ref 0.0–1.2)
CO2: 20 mmol/L (ref 20–29)
Calcium: 9.6 mg/dL (ref 8.7–10.3)
Chloride: 101 mmol/L (ref 96–106)
Creatinine, Ser: 0.74 mg/dL (ref 0.57–1.00)
Globulin, Total: 2.8 g/dL (ref 1.5–4.5)
Glucose: 103 mg/dL — ABNORMAL HIGH (ref 70–99)
Potassium: 4.5 mmol/L (ref 3.5–5.2)
Sodium: 140 mmol/L (ref 134–144)
Total Protein: 7.6 g/dL (ref 6.0–8.5)
eGFR: 88 mL/min/{1.73_m2} (ref >59)

## 2023-02-19 DIAGNOSIS — M479 Spondylosis, unspecified: Secondary | ICD-10-CM | POA: Insufficient documentation

## 2023-02-19 DIAGNOSIS — M65321 Trigger finger, right index finger: Secondary | ICD-10-CM | POA: Insufficient documentation

## 2023-02-19 DIAGNOSIS — J019 Acute sinusitis, unspecified: Secondary | ICD-10-CM | POA: Insufficient documentation

## 2023-02-19 NOTE — Assessment & Plan Note (Deleted)
Sent Celebrex

## 2023-02-19 NOTE — Assessment & Plan Note (Signed)
Sent Celebrex

## 2023-02-19 NOTE — Assessment & Plan Note (Signed)
Sent cefdimir.

## 2023-02-20 NOTE — Assessment & Plan Note (Signed)
May return for trigger finger injection if worsens.

## 2023-03-01 DIAGNOSIS — D509 Iron deficiency anemia, unspecified: Secondary | ICD-10-CM | POA: Diagnosis not present

## 2023-03-31 ENCOUNTER — Other Ambulatory Visit: Payer: Self-pay | Admitting: Family Medicine

## 2023-04-12 DIAGNOSIS — L2989 Other pruritus: Secondary | ICD-10-CM | POA: Diagnosis not present

## 2023-04-13 NOTE — Telephone Encounter (Signed)
Copied from CRM [id number]. Topic: Clinical - Medication Question >> Apr 13, 2023  9:48 AM Fuller Mandril wrote: Reason for CRM: Pt called - Was seen by derm yesterday for itchy skin. Provider thinks medication may be the cause and wanted pt to consult with Dr. Sedalia Muta to see what she thinks. States it maybe either hydrochlorothiazide (MICROZIDE) 12.5 MG capsule or rosuvastatin (CRESTOR) 5 MG tablet or both. Would like to know how to proceed.

## 2023-04-18 ENCOUNTER — Telehealth: Payer: Self-pay

## 2023-04-18 NOTE — Telephone Encounter (Signed)
Patient Made Aware, Verbalized Understanding. 

## 2023-04-18 NOTE — Telephone Encounter (Signed)
Copied from CRM [id number]. Topic: Clinical - Medication Question >> Apr 18, 2023 10:03 AM Almira Coaster wrote: Reason for CRM:  Pt called - Was seen by derm for itchy skin. Provider thinks medication may be the cause and wanted pt to consult with Dr. Sedalia Muta to see what she thinks. States it maybe either hydrochlorothiazide (MICROZIDE) 12.5 MG capsule or rosuvastatin (CRESTOR) 5 MG tablet or both. Would like to speak with someone from the clinic.

## 2023-04-18 NOTE — Telephone Encounter (Signed)
Called patient she stated that she has stop the Crestor but not hydrochlorothiazide, she want to see what provider suggested. She is still having the itching.

## 2023-04-25 ENCOUNTER — Encounter: Payer: Self-pay | Admitting: Family Medicine

## 2023-04-25 ENCOUNTER — Ambulatory Visit: Payer: Self-pay | Admitting: Family Medicine

## 2023-04-25 NOTE — Telephone Encounter (Signed)
Copied from CRM [id number]. Topic: Clinical - Pink Word Triage >> Apr 25, 2023  4:01 PM Tiffany H wrote: Reason for Triage: Pink Word: Allergic Reaction: Patient called to advise that Dr. Sedalia Muta took her off of her blood pressure medication at the recommendation of dermatology. Patient advised she's still itching.  Chief Complaint: severe itching Symptoms: itching to back, head,hands, chest Frequency: has become worse Pertinent Negatives: Patient denies fever, sob, any other symptoms Disposition: [] ED /[] Urgent Care (no appt availability in office) / [] Appointment(In office/virtual)/ []  [facility]/ [x] Home Care/ [] Refused Recommended Disposition /[] [facility]/ []  Follow-up with PCP Additional Notes: Patient called in with c/o itching.  States dermatologist and Dr. Sedalia Muta agreed to stop some medication to see if itching resolves.  Patient reports she has stopped bp meds and hasn't noticed a difference.  Would like to know if it could take a while for medicine to leave the body.  Patient also reports that the medication for itching is not working well.  Message to PCP office sent.  Instructed to go to er if becomes worse.   Reason for Disposition  [1] MODERATE-SEVERE widespread itching (i.e., interferes with sleep, normal activities or school) AND [2] not improved after 24 hours of itching Care Advice  Answer Assessment - Initial Assessment Questions 1. DESCRIPTION: "Describe the itching you are having."     Allergic reaction to medication, head, back, hands, trunk 2. SEVERITY: "How bad is it?"    - MILD: Doesn't interfere with normal activities.   - MODERATE-SEVERE: Interferes with work, school, sleep, or other activities.      severe 3. SCRATCHING: "Are there any scratch marks? Bleeding?"     Yes 4. ONSET: "When did this begin?"      Itchy skin for years gotten worse 5. CAUSE: "What do you think is causing the itching?" (ask about swimming pools, pollen, animals, soaps,  etc.)     medication 6. OTHER SYMPTOMS: "Do you have any other symptoms?"  no  Protocols used: Itching - [facility]

## 2023-04-26 ENCOUNTER — Encounter: Payer: Self-pay | Admitting: Family Medicine

## 2023-04-26 ENCOUNTER — Ambulatory Visit (INDEPENDENT_AMBULATORY_CARE_PROVIDER_SITE_OTHER): Payer: PPO | Admitting: Family Medicine

## 2023-04-26 VITALS — BP 138/72 | HR 73 | Temp 97.7°F | Ht 63.5 in | Wt 183.8 lb

## 2023-04-26 DIAGNOSIS — L299 Pruritus, unspecified: Secondary | ICD-10-CM | POA: Diagnosis not present

## 2023-04-26 DIAGNOSIS — L209 Atopic dermatitis, unspecified: Secondary | ICD-10-CM | POA: Insufficient documentation

## 2023-04-26 DIAGNOSIS — I10 Essential (primary) hypertension: Secondary | ICD-10-CM | POA: Diagnosis not present

## 2023-04-26 MED ORDER — ZORYVE 0.15 % EX CREA: 1.0000 | TOPICAL_CREAM | Freq: Every day | CUTANEOUS | 0 refills | Status: DC

## 2023-04-26 MED ORDER — CETIRIZINE HCL 10 MG PO TABS
10.0000 mg | ORAL_TABLET | Freq: Every day | ORAL | 11 refills | Status: AC
Start: 2023-04-26 — End: ?

## 2023-04-26 MED ORDER — TRIAMCINOLONE ACETONIDE 40 MG/ML IJ SUSP
Freq: Once | INTRAMUSCULAR | Status: DC
Start: 2023-04-26 — End: 2023-04-26

## 2023-04-26 MED ORDER — TRIAMCINOLONE ACETONIDE 40 MG/ML IJ SUSP
40.0000 mg | Freq: Once | INTRAMUSCULAR | Status: AC
Start: 2023-04-26 — End: 2023-04-26
  Administered 2023-04-26: 40 mg via INTRAMUSCULAR

## 2023-04-26 NOTE — Assessment & Plan Note (Signed)
Stable blood pressure despite discontinuation of hydrochlorothiazide. No reported edema. -Continue off hydrochlorothiazide given lack of impact on blood pressure and potential contribution to pruritus. -Follow up with Dr. Sedalia Muta in April for chronic management and potential lab work.

## 2023-04-26 NOTE — Progress Notes (Signed)
Acute Office Visit  Subjective:    Patient ID: Karen Clay, female    DOB: 1956/02/22, 67 y.o.   MRN: [id number]  Chief Complaint  Patient presents with   Medication follow up    Discussed the use of AI scribe software for clinical note transcription with the patient, who gave verbal consent to proceed.   HPI: Patient is in today for medication follow up. Patient states that she was instructed to stop taking hydrochlorothiazide to see if her itching and rash subsides. She did discontinue the medication but rash and itching is still there. She states that she was following up to see if she needs to go back to taking the hydrochlorothiazide.  The patient, with a history of chronic itching, presents with worsening symptoms. The itching is generalized, affecting multiple areas including the head, ear, and back. The patient reports that the itching has led to scratching, which has resulted in bleeding. Despite attempts to manage the condition with antihistamines and discontinuation of hydrochlorothiazide, the itching has persisted. The patient has been using a prescription topical medication, triamcinolone, twice daily for an extended period, which provides some relief but requires constant use. The patient has not noticed any changes in blood pressure or swelling since discontinuing hydrochlorothiazide. The patient is scheduled to see a dermatologist in February. Patient states that her soaps are unscented but that her laundry detergent is scented.   Past Medical History:  Diagnosis Date   Atrophy of thyroid    Benign carcinoid tumor of the stomach    CTS (carpal tunnel syndrome)    Essential hypertension    Localized osteoarthritis of knees, bilateral 02/14/2014   Lumbar pain 07/09/2019   Major depressive disorder    Melena    Mixed hyperlipidemia    Nonalcoholic fatty liver disease    Osteoarthritis    S/P left unicompartmental knee replacement 07/25/2014   Sleep apnea     Status post total right knee replacement using cement 01/29/2016   Vitamin B deficiency     Past Surgical History:  Procedure Laterality Date   CESAREAN SECTION     REPLACEMENT TOTAL KNEE Right    unicompartmental knee replacement Left     Family History  Problem Relation Age of Onset   Aneurysm Mother    Heart failure Father    CAD Father     Social History   Socioeconomic History   Marital status: Married    Spouse name: Alden Server   Number of children: 3   Years of education: Not on file   Highest education level: Not on file  Occupational History   Occupation: Self Employeed, pottery painting class  Tobacco Use   Smoking status: Former    Types: Cigarettes   Smokeless tobacco: Never  Vaping Use   Vaping status: Never Used  Substance and Sexual Activity   Alcohol use: Not Currently    Comment: occasional glass of wine   Drug use: No   Sexual activity: Not on file  Other Topics Concern   Not on file  Social History Narrative   Not on file   Social Drivers of Health   Financial Resource Strain: Low Risk  (02/09/2022)   Overall Financial Resource Strain (CARDIA)    Difficulty of Paying Living Expenses: Not hard at all  Food Insecurity: No Food Insecurity (02/09/2022)   Hunger Vital Sign    Worried About Running Out of Food in the Last Year: Never true    Ran Out of  Food in the Last Year: Never true  Transportation Needs: No Transportation Needs (08/04/2022)   PRAPARE - Administrator, Civil Service (Medical): No    Lack of Transportation (Non-Medical): No  Physical Activity: Insufficiently Active (02/09/2022)   Exercise Vital Sign    Days of Exercise per Week: 2 days    Minutes of Exercise per Session: 50 min  Stress: No Stress Concern Present (02/09/2022)   [employer] of Occupational Health - Occupational Stress Questionnaire    Feeling of Stress : Not at all  Social Connections: Socially Integrated (02/09/2022)   Social Connection and  Isolation Panel [NHANES]    Frequency of Communication with Friends and Family: More than three times a week    Frequency of Social Gatherings with Friends and Family: Twice a week    Attends Religious Services: More than 4 times per year    Active Member of [employer] or Organizations: Yes    Attends Engineer, structural: More than 4 times per year    Marital Status: Married  Catering manager Violence: Not At Risk (02/09/2022)   Humiliation, Afraid, Rape, and Kick questionnaire    Fear of Current or Ex-Partner: No    Emotionally Abused: No    Physically Abused: No    Sexually Abused: No    Outpatient Medications Prior to Visit  Medication Sig Dispense Refill   buPROPion (WELLBUTRIN XL) 300 MG 24 hr tablet Take 300 mg by mouth daily.      celecoxib (CELEBREX) 200 MG capsule Take 1 capsule (200 mg total) by mouth daily. 30 capsule 3   cyanocobalamin (VITAMIN B12) 1000 MCG/ML injection INJECT 1 ML INTRAMUSCULARLY ONCE A MONTH 3 mL 2   FLUoxetine (PROZAC) 20 MG capsule TAKE 1 CAPSULE BY MOUTH TWICE DAILY 180 capsule 2   levothyroxine (SYNTHROID) 75 MCG tablet TAKE 1 TABLET BY MOUTH ONCE DAILY 90 tablet 1   losartan (COZAAR) 50 MG tablet TAKE 1 TABLET BY MOUTH DAILY WITH HCTZ 90 tablet 1   MISC NATURAL PRODUCTS PO Take 1 tablet by mouth daily. Blood builder supplement from health food store     potassium chloride SA (KLOR-CON M) 20 MEQ tablet TAKE 1 TABLET BY MOUTH DAILY 90 tablet 1   rosuvastatin (CRESTOR) 5 MG tablet TAKE 1 TABLET BY MOUTH DAILY 90 tablet 0   triamcinolone ointment (KENALOG) 0.1 % Apply 1 application topically 2 (two) times daily. 80 g 1   hydrochlorothiazide (MICROZIDE) 12.5 MG capsule TAKE 1 CAPSULE BY MOUTH DAILY WITH LOSARTAN (Patient not taking: Reported on 04/26/2023) 90 capsule 1   cefdinir (OMNICEF) 300 MG capsule Take 1 capsule (300 mg total) by mouth 2 (two) times daily. 20 capsule 0   No facility-administered medications prior to visit.    Allergies   Allergen Reactions   Ace Inhibitors     cough   Niaspan [Niacin]    Zetia [Ezetimibe]     Review of Systems  Constitutional:  Negative for appetite change, fatigue and fever.  HENT:  Negative for congestion, ear pain, sinus pressure and sore throat.   Respiratory:  Negative for cough, chest tightness, shortness of breath and wheezing.   Cardiovascular:  Negative for chest pain and palpitations.  Gastrointestinal:  Negative for abdominal pain, constipation, diarrhea, nausea and vomiting.  Genitourinary:  Negative for dysuria and hematuria.  Musculoskeletal:  Negative for arthralgias, back pain, joint swelling and myalgias.  Skin:  Positive for rash.  Neurological:  Negative for dizziness, weakness and headaches.  Psychiatric/Behavioral:  Negative for dysphoric mood. The patient is not nervous/anxious.        Objective:        04/26/2023    2:41 PM 02/16/2023   10:27 AM 08/17/2022   10:14 AM  Vitals with BMI  Height 5' 3.5" 5' 3.5" 5' 3.5"  Weight 183 lbs 13 oz 180 lbs 13 oz 178 lbs 6 oz  BMI 32.04 31.52 31.1  Systolic 138 138 096  Diastolic 72 72 64  Pulse 73 71 60    Orthostatic VS for the past 72 hrs (Last 3 readings):  Patient Position BP Location  04/26/23 1441 Sitting Left Arm     Physical Exam Constitutional:      General: She is not in acute distress.    Appearance: Normal appearance. She is not ill-appearing.  Eyes:     Conjunctiva/sclera: Conjunctivae normal.  Cardiovascular:     Rate and Rhythm: Normal rate and regular rhythm.     Heart sounds: Normal heart sounds.  Pulmonary:     Effort: Pulmonary effort is normal.     Breath sounds: Normal breath sounds. No wheezing.  Musculoskeletal:        General: Normal range of motion.  Skin:    General: Skin is warm.     Findings: Rash present. Rash is urticarial.     Comments: Scabs all over from itching  Neurological:     Mental Status: She is alert. Mental status is at baseline.  Psychiatric:         Mood and Affect: Mood normal.        Behavior: Behavior normal.     Health Maintenance Due  Topic Date Due   Diabetic kidney evaluation - Urine ACR  Never done   Hepatitis C Screening  Never done   Zoster Vaccines- Shingrix (2 of 2) 04/05/2018   Pneumonia Vaccine 39+ Years old (3 of 3 - PPSV23 or PCV20) 06/02/2018   DEXA SCAN  Never done   OPHTHALMOLOGY EXAM  03/10/2020   DTaP/Tdap/Td (3 - Td or Tdap) 08/26/2020   FOOT EXAM  07/08/2021   COVID-19 Vaccine (5 - 2024-25 season) 01/09/2023   HEMOGLOBIN A1C  02/02/2023    There are no preventive care reminders to display for this patient.   Lab Results  Component Value Date   TSH 1.360 08/17/2022   Lab Results  Component Value Date   WBC 8.0 02/16/2023   HGB 13.0 02/16/2023   HCT 40.1 02/16/2023   MCV 100 (H) 02/16/2023   PLT 226 02/16/2023   Lab Results  Component Value Date   NA 140 02/17/2023   K 4.5 02/17/2023   CO2 20 02/17/2023   GLUCOSE 103 (H) 02/17/2023   BUN 32 (H) 02/17/2023   CREATININE 0.74 02/17/2023   BILITOT 0.3 02/17/2023   ALKPHOS 83 02/17/2023   AST 31 02/17/2023   ALT 41 (H) 02/17/2023   PROT 7.6 02/17/2023   ALBUMIN 4.8 02/17/2023   CALCIUM 9.6 02/17/2023   EGFR 88 02/17/2023   Lab Results  Component Value Date   CHOL 177 02/16/2023   Lab Results  Component Value Date   HDL 59 02/16/2023   Lab Results  Component Value Date   LDLCALC 93 02/16/2023   Lab Results  Component Value Date   TRIG 142 02/16/2023   Lab Results  Component Value Date   CHOLHDL 3.0 02/16/2023   Lab Results  Component Value Date   HGBA1C 5.5 07/09/2021  Assessment & Plan:  Pruritus Assessment & Plan: Patient states that the hydroxyzine 25 mg causes her to be sleepy and  can only use it at night. - Continue medication management @ [facility]  Orders: -     Triamcinolone Acetonide  Atopic dermatitis, unspecified type Assessment & Plan: Persistent itching despite discontinuation of  hydrochlorothiazide and use of antihistamines occasionally and topical triamcinolone. Dermatology consult scheduled for February. -Administer corticosteroid injection today for immediate relief. -Order Victorino December, a nonsteroidal topical medication, pending insurance approval. -Recommend daily use of Zyrtec. -Advise patient to consider switching to sensitive/unscented soaps and detergents.   Orders: -     Cetirizine HCl; Take 1 tablet (10 mg total) by mouth daily.  Dispense: 30 tablet; Refill: 11 -     Zoryve; Apply 1 Application topically daily.  Dispense: 60 g; Refill: 0 -     Triamcinolone Acetonide  Essential hypertension Assessment & Plan: Stable blood pressure despite discontinuation of hydrochlorothiazide. No reported edema. -Continue off hydrochlorothiazide given lack of impact on blood pressure and potential contribution to pruritus. -Follow up with Dr. Sedalia Muta in April for chronic management and potential lab work.      Meds ordered this encounter  Medications   cetirizine (ZYRTEC) 10 MG tablet    Sig: Take 1 tablet (10 mg total) by mouth daily.    Dispense:  30 tablet    Refill:  11   DISCONTD: bupivacaine(PF) (MARCAINE) 0.5 % 10 mL, triamcinolone acetonide (KENALOG-40) 40 mg injection   Roflumilast (ZORYVE) 0.15 % CREA    Sig: Apply 1 Application topically daily.    Dispense:  60 g    Refill:  0   triamcinolone acetonide (KENALOG-40) injection 40 mg    No orders of the defined types were placed in this encounter.    Follow-up: Return in about 2 days (around 04/28/2023) for awv, Chronic fu with Dr. Sedalia Muta in March 2025.  An After Visit Summary was printed and given to the patient.  Total time spent on today's visit was 32 minutes, including both face-to-face time and nonface-to-face time personally spent on review of chart (labs and imaging), discussing labs and goals, discussing further work-up, treatment options, referrals to specialist if needed, reviewing outside records  if pertinent, answering patient's questions, and coordinating care.    Lajuana Matte, FNP [facility] [phone]

## 2023-04-26 NOTE — Assessment & Plan Note (Signed)
Persistent itching despite discontinuation of hydrochlorothiazide and use of antihistamines occasionally and topical triamcinolone. Dermatology consult scheduled for February. -Administer corticosteroid injection today for immediate relief. -Order Victorino December, a nonsteroidal topical medication, pending insurance approval. -Recommend daily use of Zyrtec. -Advise patient to consider switching to sensitive/unscented soaps and detergents.

## 2023-04-26 NOTE — Telephone Encounter (Signed)
Patient scheduled appt for today at 240.

## 2023-04-26 NOTE — Assessment & Plan Note (Signed)
Patient states that the hydroxyzine 25 mg causes her to be sleepy and  can only use it at night. - Continue medication management @ [facility]

## 2023-04-28 ENCOUNTER — Ambulatory Visit: Payer: PPO

## 2023-05-25 DIAGNOSIS — N943 Premenstrual tension syndrome: Secondary | ICD-10-CM | POA: Diagnosis not present

## 2023-05-25 DIAGNOSIS — F411 Generalized anxiety disorder: Secondary | ICD-10-CM | POA: Diagnosis not present

## 2023-05-25 DIAGNOSIS — F331 Major depressive disorder, recurrent, moderate: Secondary | ICD-10-CM | POA: Diagnosis not present

## 2023-05-31 ENCOUNTER — Ambulatory Visit: Payer: PPO

## 2023-05-31 VITALS — Ht 63.5 in | Wt 179.0 lb

## 2023-05-31 DIAGNOSIS — Z0000 Encounter for general adult medical examination without abnormal findings: Secondary | ICD-10-CM

## 2023-05-31 NOTE — Progress Notes (Signed)
Subjective:   Karen Clay is a 68 y.o. female who presents for Medicare Annual (Subsequent) preventive examination.  Visit Complete: Virtual I connected with  Karen Clay on 05/31/23 by a audio enabled telemedicine application and verified that I am speaking with the correct person using two identifiers.  Patient Location: Home  Provider Location: Office/Clinic  I discussed the limitations of evaluation and management by telemedicine. The patient expressed understanding and agreed to proceed.  Vital Signs: Because this visit was a virtual/telehealth visit, some criteria may be missing or patient reported. Any vitals not documented were not able to be obtained and vitals that have been documented are patient reported.  This patient declined Interactive audio and Acupuncturist. Therefore the visit was completed with audio only. Had trouble connecting to video.  Cardiac Risk Factors include: advanced age (>5men, >40 women);dyslipidemia;hypertension;obesity (BMI >30kg/m2);family history of premature cardiovascular disease     Objective:    Today's Vitals   05/31/23 1424  Weight: 179 lb (81.2 kg)  Height: 5' 3.5" (1.613 m)  PainSc: 0-No pain   Body mass index is 31.21 kg/m.     05/31/2023    2:26 PM 02/04/2021   11:38 AM 10/23/2019   10:53 AM 07/09/2019    9:55 AM  Advanced Directives  Does Patient Have a Medical Advance Directive? Yes Yes Yes Yes  Type of Estate agent of [place];Living will Healthcare Power of [place];Living will Living will;Healthcare Power of [employer] Power of [place];Living will  Does patient want to make changes to medical advance directive?  No - Patient declined No - Patient declined   Copy of Healthcare Power of Attorney in Chart? No - copy requested No - copy requested No - copy requested     Current Medications (verified) Outpatient Encounter Medications as of 05/31/2023   Medication Sig   buPROPion (WELLBUTRIN XL) 300 MG 24 hr tablet Take 300 mg by mouth daily.    celecoxib (CELEBREX) 200 MG capsule Take 1 capsule (200 mg total) by mouth daily.   cetirizine (ZYRTEC) 10 MG tablet Take 1 tablet (10 mg total) by mouth daily.   cyanocobalamin (VITAMIN B12) 1000 MCG/ML injection INJECT 1 ML INTRAMUSCULARLY ONCE A MONTH   FLUoxetine (PROZAC) 20 MG capsule TAKE 1 CAPSULE BY MOUTH TWICE DAILY   hydrochlorothiazide (MICROZIDE) 12.5 MG capsule TAKE 1 CAPSULE BY MOUTH DAILY WITH LOSARTAN (Patient not taking: Reported on 04/26/2023)   levothyroxine (SYNTHROID) 75 MCG tablet TAKE 1 TABLET BY MOUTH ONCE DAILY   losartan (COZAAR) 50 MG tablet TAKE 1 TABLET BY MOUTH DAILY WITH HCTZ   MISC NATURAL PRODUCTS PO Take 1 tablet by mouth daily. Blood builder supplement from health food store   potassium chloride SA (KLOR-CON M) 20 MEQ tablet TAKE 1 TABLET BY MOUTH DAILY   Roflumilast (ZORYVE) 0.15 % CREA Apply 1 Application topically daily.   rosuvastatin (CRESTOR) 5 MG tablet TAKE 1 TABLET BY MOUTH DAILY   triamcinolone ointment (KENALOG) 0.1 % Apply 1 application topically 2 (two) times daily.   No facility-administered encounter medications on file as of 05/31/2023.    Allergies (verified) Ace inhibitors, Niaspan [niacin], and Zetia [ezetimibe]   History: Past Medical History:  Diagnosis Date   Atrophy of thyroid    Benign carcinoid tumor of the stomach    CTS (carpal tunnel syndrome)    Essential hypertension    Localized osteoarthritis of knees, bilateral 02/14/2014   Lumbar pain 07/09/2019   Major depressive disorder  Melena    Mixed hyperlipidemia    Nonalcoholic fatty liver disease    Osteoarthritis    S/P left unicompartmental knee replacement 07/25/2014   Sleep apnea    Status post total right knee replacement using cement 01/29/2016   Vitamin B deficiency    Past Surgical History:  Procedure Laterality Date   CESAREAN SECTION     REPLACEMENT TOTAL KNEE  Right    unicompartmental knee replacement Left    Family History  Problem Relation Age of Onset   Aneurysm Mother    Heart failure Father    CAD Father    Social History   Socioeconomic History   Marital status: Married    Spouse name: Alden Server   Number of children: 3   Years of education: Not on file   Highest education level: Not on file  Occupational History   Occupation: Self Employeed, pottery painting class  Tobacco Use   Smoking status: Former    Types: Cigarettes    Passive exposure: Never   Smokeless tobacco: Never  Vaping Use   Vaping status: Never Used  Substance and Sexual Activity   Alcohol use: Not Currently    Comment: occasional glass of wine   Drug use: No   Sexual activity: Not on file  Other Topics Concern   Not on file  Social History Narrative   Not on file   Social Drivers of Health   Financial Resource Strain: Low Risk  (05/31/2023)   Overall Financial Resource Strain (CARDIA)    Difficulty of Paying Living Expenses: Not hard at all  Food Insecurity: No Food Insecurity (05/31/2023)   Hunger Vital Sign    Worried About Running Out of Food in the Last Year: Never true    Ran Out of Food in the Last Year: Never true  Transportation Needs: No Transportation Needs (05/31/2023)   PRAPARE - Administrator, Civil Service (Medical): No    Lack of Transportation (Non-Medical): No  Physical Activity: Sufficiently Active (05/31/2023)   Exercise Vital Sign    Days of Exercise per Week: 5 days    Minutes of Exercise per Session: 50 min  Stress: No Stress Concern Present (05/31/2023)   [employer] of Occupational Health - Occupational Stress Questionnaire    Feeling of Stress : Not at all  Social Connections: Socially Integrated (05/31/2023)   Social Connection and Isolation Panel [NHANES]    Frequency of Communication with Friends and Family: More than three times a week    Frequency of Social Gatherings with Friends and Family: Twice a  week    Attends Religious Services: More than 4 times per year    Active Member of [employer] or Organizations: Yes    Attends Engineer, structural: More than 4 times per year    Marital Status: Married    Tobacco Counseling Counseling given: Not Answered   Clinical Intake:  Pre-visit preparation completed: Yes  Pain : No/denies pain Pain Score: 0-No pain     BMI - recorded: 31.21 Nutritional Status: BMI > 30  Obese Nutritional Risks: None Diabetes: No  How often do you need to have someone help you when you read instructions, pamphlets, or other written materials from your doctor or pharmacy?: 1 - Never What is the last grade level you completed in school?: 2 years of college  Interpreter Needed?: No  Information entered by :: Kallyn Demarcus N. Destynee Stringfellow, LPN.   Activities of Daily Living    05/31/2023  2:28 PM 08/04/2022    9:25 AM  In your present state of health, do you have any difficulty performing the following activities:  Hearing? 0 0  Vision? 0 0  Difficulty concentrating or making decisions? 0 0  Walking or climbing stairs? 0 0  Dressing or bathing? 0 0  Doing errands, shopping? 0 0  Preparing Food and eating ? N   Using the Toilet? N   In the past six months, have you accidently leaked urine? N   Do you have problems with loss of bowel control? N   Managing your Medications? N   Managing your Finances? N   Housekeeping or managing your Housekeeping? N     Patient Care Team: Blane Ohara, MD as PCP - General (Family Medicine) Misenheimer, Marcial Pacas, MD as Consulting Physician (Unknown Physician Specialty) Earvin Hansen, [facility] (Inactive) as Pharmacist (Pharmacist) Kern Reap, MD as Consulting Physician (Obstetrics and Gynecology) Birdie Sons, OD as Consulting Physician (Ophthalmology)  Indicate any recent Medical Services you may have received from other than [facility] providers in the past year (date may be approximate).     Assessment:   This  is a routine wellness examination for [place].  Hearing/Vision screen Hearing Screening - Comments:: Denies hearing difficulties.   Vision Screening - Comments:: Wears rx glasses - up to date with routine eye exams with [facility]    Goals Addressed             This Visit's Progress    Client understands the importance of follow-up with providers by attending scheduled visits        Depression Screen    05/31/2023    2:29 PM 02/16/2023   10:29 AM 08/04/2022    9:24 AM 02/09/2022   10:28 AM 07/09/2021   10:32 AM 02/04/2021   11:34 AM 01/08/2021    9:50 AM  PHQ 2/9 Scores  PHQ - 2 Score 0 0 0 0 0 0 0  PHQ- 9 Score 0 0 0 0  0 0    Fall Risk    05/31/2023    2:27 PM 02/16/2023   10:29 AM 08/04/2022    9:23 AM 02/09/2022   10:28 AM 07/09/2021   10:32 AM  Fall Risk   Falls in the past year? 0 0 0 1 0  Number falls in past yr: 0 0 0 0 0  Injury with Fall? 0 0 0 0 0  Risk for fall due to : No Fall Risks No Fall Risks No Fall Risks No Fall Risks   Follow up Falls prevention discussed Falls evaluation completed Falls evaluation completed Falls evaluation completed Falls evaluation completed    MEDICARE RISK AT HOME: Medicare Risk at Home Any stairs in or around the home?: Yes If so, are there any without handrails?: No Home free of loose throw rugs in walkways, pet beds, electrical cords, etc?: Yes Adequate lighting in your home to reduce risk of falls?: Yes Life alert?: No Use of a cane, walker or w/c?: No Grab bars in the bathroom?: Yes Shower chair or bench in shower?: Yes Elevated toilet seat or a handicapped toilet?: Yes  TIMED UP AND GO:  Was the test performed?  No    Cognitive Function:    05/31/2023    2:28 PM  MMSE - Mini Mental State Exam  Not completed: Unable to complete        05/31/2023    2:28 PM 02/04/2021   11:57 AM 10/24/2019  2:03 PM  6CIT Screen  What Year? 0 points 0 points 0 points  What month? 0 points 0 points 0 points   What time? 0 points 0 points 0 points  Count back from 20 0 points 0 points 0 points  Months in reverse 0 points 0 points 0 points  Repeat phrase 0 points 0 points 0 points  Total Score 0 points 0 points 0 points    Immunizations Immunization History  Administered Date(s) Administered   Fluad Quad(high Dose 65+) 02/19/2020, 01/08/2021, 02/09/2022   Fluad Trivalent(High Dose 65+) 02/16/2023   Influenza-Unspecified 01/08/2019   PFIZER(Purple Top)SARS-COV-2 Vaccination 05/27/2019, 06/16/2019, 02/19/2020   Pfizer Covid-19 Vaccine Bivalent Booster 69yrs & up 07/09/2021   Pneumococcal Conjugate-13 08/21/2014   Pneumococcal Polysaccharide-23 03/23/2012   Td 08/27/2010   Tdap 08/27/2010   Zoster Recombinant(Shingrix) 02/08/2018    TDAP status: Due, Education has been provided regarding the importance of this vaccine. Advised may receive this vaccine at local pharmacy or Health Dept. Aware to provide a copy of the vaccination record if obtained from local pharmacy or Health Dept. Verbalized acceptance and understanding.  Flu Vaccine status: Up to date  Pneumococcal vaccine status: Due, Education has been provided regarding the importance of this vaccine. Advised may receive this vaccine at local pharmacy or Health Dept. Aware to provide a copy of the vaccination record if obtained from local pharmacy or Health Dept. Verbalized acceptance and understanding.  Covid-19 vaccine status: Completed vaccines  Qualifies for Shingles Vaccine? Yes   Zostavax completed No   Shingrix Completed?: No.    Education has been provided regarding the importance of this vaccine. Patient has been advised to call insurance company to determine out of pocket expense if they have not yet received this vaccine. Advised may also receive vaccine at local pharmacy or Health Dept. Verbalized acceptance and understanding.  Screening Tests Health Maintenance  Topic Date Due   Diabetic kidney evaluation - Urine ACR   Never done   Hepatitis C Screening  Never done   Zoster Vaccines- Shingrix (2 of 2) 04/05/2018   Pneumonia Vaccine 9+ Years old (3 of 3 - PPSV23 or PCV20) 06/02/2018   DEXA SCAN  Never done   OPHTHALMOLOGY EXAM  03/10/2020   DTaP/Tdap/Td (3 - Td or Tdap) 08/26/2020   FOOT EXAM  07/08/2021   COVID-19 Vaccine (5 - 2024-25 season) 01/09/2023   HEMOGLOBIN A1C  02/02/2023   MAMMOGRAM  11/16/2023   Diabetic kidney evaluation - eGFR measurement  02/17/2024   Medicare Annual Wellness (AWV)  05/30/2024   Colonoscopy  06/24/2027   INFLUENZA VACCINE  Completed   HPV VACCINES  Aged Out    Health Maintenance  Health Maintenance Due  Topic Date Due   Diabetic kidney evaluation - Urine ACR  Never done   Hepatitis C Screening  Never done   Zoster Vaccines- Shingrix (2 of 2) 04/05/2018   Pneumonia Vaccine 26+ Years old (3 of 3 - PPSV23 or PCV20) 06/02/2018   DEXA SCAN  Never done   OPHTHALMOLOGY EXAM  03/10/2020   DTaP/Tdap/Td (3 - Td or Tdap) 08/26/2020   FOOT EXAM  07/08/2021   COVID-19 Vaccine (5 - 2024-25 season) 01/09/2023   HEMOGLOBIN A1C  02/02/2023    Colorectal cancer screening: Type of screening: Colonoscopy. Completed 06/23/2017. Repeat every 10 years  Mammogram status: Completed 11/16/2022. Repeat every year  Bone Density status: Never done  Lung Cancer Screening: (Low Dose CT Chest recommended if Age 37-80 years, 20 pack-year currently  smoking OR have quit w/in 15years.) does not qualify.   Lung Cancer Screening Referral: no  Additional Screening:  Hepatitis C Screening: does qualify; Completed: no  Vision Screening: Recommended annual ophthalmology exams for early detection of glaucoma and other disorders of the eye. Is the patient up to date with their annual eye exam?  Yes  Who is the provider or what is the name of the office in which the patient attends annual eye exams? [facility] If pt is not established with a provider, would they like to be  referred to a provider to establish care? No .   Dental Screening: Recommended annual dental exams for proper oral hygiene  Community Resource Referral / Chronic Care Management: CRR required this visit?  No   CCM required this visit?  No     Plan:     I have personally reviewed and noted the following in the patient's chart:   Medical and social history Use of alcohol, tobacco or illicit drugs  Current medications and supplements including opioid prescriptions. Patient is not currently taking opioid prescriptions. Functional ability and status Nutritional status Physical activity Advanced directives List of other physicians Hospitalizations, surgeries, and ER visits in previous 12 months Vitals Screenings to include cognitive, depression, and falls Referrals and appointments  In addition, I have reviewed and discussed with patient certain preventive protocols, quality metrics, and best practice recommendations. A written personalized care plan for preventive services as well as general preventive health recommendations were provided to patient.     Mickeal Needy, LPN   5/78/4696   After Visit Summary: (MyChart) Due to this being a telephonic visit, the after visit summary with patients personalized plan was offered to patient via MyChart   Nurse Notes: Patient is overdue for pneumococcal, shingrix and Tdap vaccines.

## 2023-05-31 NOTE — Patient Instructions (Signed)
Karen Clay , Thank you for taking time to come for your Medicare Wellness Visit. I appreciate your ongoing commitment to your health goals. Please review the following plan we discussed and let me know if I can assist you in the future.   Referrals/Orders/Follow-Ups/Clinician Recommendations: Yes; Keep maintaining your health by keeping your appointments with Dr. Sedalia Muta and any specialists that you may see.  Call us if you need anything.  Have a great year!!!!  This is a list of the screening recommended for you and due dates:  Health Maintenance  Topic Date Due   Yearly kidney health urinalysis for diabetes  Never done   Hepatitis C Screening  Never done   Zoster (Shingles) Vaccine (2 of 2) 04/05/2018   Pneumonia Vaccine (3 of 3 - PPSV23 or PCV20) 06/02/2018   DEXA scan (bone density measurement)  Never done   Eye exam for diabetics  03/10/2020   DTaP/Tdap/Td vaccine (3 - Td or Tdap) 08/26/2020   Complete foot exam   07/08/2021   COVID-19 Vaccine (5 - 2024-25 season) 01/09/2023   Hemoglobin A1C  02/02/2023   Mammogram  11/16/2023   Yearly kidney function blood test for diabetes  02/17/2024   Medicare Annual Wellness Visit  05/30/2024   Colon Cancer Screening  06/24/2027   Flu Shot  Completed   HPV Vaccine  Aged Out    Advanced directives: (Copy Requested) Please bring a copy of your health care power of attorney and living will to the office to be added to your chart at your convenience.  Next Medicare Annual Wellness Visit scheduled for next year: No

## 2023-06-14 DIAGNOSIS — L308 Other specified dermatitis: Secondary | ICD-10-CM | POA: Diagnosis not present

## 2023-06-14 DIAGNOSIS — L2089 Other atopic dermatitis: Secondary | ICD-10-CM | POA: Diagnosis not present

## 2023-06-14 DIAGNOSIS — D485 Neoplasm of uncertain behavior of skin: Secondary | ICD-10-CM | POA: Diagnosis not present

## 2023-06-16 ENCOUNTER — Other Ambulatory Visit: Payer: Self-pay | Admitting: Family Medicine

## 2023-06-20 ENCOUNTER — Other Ambulatory Visit: Payer: Self-pay | Admitting: Family Medicine

## 2023-06-30 ENCOUNTER — Encounter: Payer: Self-pay | Admitting: Family Medicine

## 2023-06-30 ENCOUNTER — Telehealth: Payer: Self-pay

## 2023-06-30 NOTE — Telephone Encounter (Signed)
Letterhead sent to fax [phone] per patient request and verbal ok given by Dr Sedalia Muta.  Copied from CRM [id number]. Topic: Clinical - Prescription Issue >> Jun 30, 2023  2:06 PM Martha Clan wrote: Reason for CRM: Patient's CPaP machine broke after 5 years. Need a prescription resent stating she still requires it. [facility] Fax: [phone]

## 2023-07-04 ENCOUNTER — Other Ambulatory Visit: Payer: Self-pay | Admitting: Family Medicine

## 2023-07-06 ENCOUNTER — Telehealth: Payer: Self-pay

## 2023-07-06 NOTE — Telephone Encounter (Signed)
 Spoke to [place] at [employer] . Stated that an addendum can be made to patients office note from 02/2023 stating, "CPAP older than 5 years, patient still benefits from the machine, needs replacement." Then we can fax the office note to number below.  Copied from CRM [id number]. Topic: Medical Record Request - Records Request >> Jul 05, 2023  3:06 PM Higinio Roger wrote: Reason for CRM: Bjorn Loser from [facility] is needing paperwork showing patient's CPAP machine is over five years old for CPAP prescription. Callback #: [phone] option 4. Fax #: [phone]

## 2023-07-07 DIAGNOSIS — G4733 Obstructive sleep apnea (adult) (pediatric): Secondary | ICD-10-CM | POA: Insufficient documentation

## 2023-07-07 DIAGNOSIS — Z23 Encounter for immunization: Secondary | ICD-10-CM | POA: Insufficient documentation

## 2023-07-07 NOTE — Assessment & Plan Note (Signed)
 CPAP older than 5 years, patient still benefits from the machine, needs replacement.

## 2023-07-07 NOTE — Telephone Encounter (Signed)
 Faxed within epic. Dr. Sedalia Muta

## 2023-07-11 DIAGNOSIS — G4733 Obstructive sleep apnea (adult) (pediatric): Secondary | ICD-10-CM | POA: Diagnosis not present

## 2023-07-15 DIAGNOSIS — G4733 Obstructive sleep apnea (adult) (pediatric): Secondary | ICD-10-CM | POA: Diagnosis not present

## 2023-08-02 DIAGNOSIS — L2089 Other atopic dermatitis: Secondary | ICD-10-CM | POA: Diagnosis not present

## 2023-08-03 ENCOUNTER — Telehealth: Payer: Self-pay | Admitting: Family Medicine

## 2023-08-03 NOTE — Telephone Encounter (Signed)
 [facility] PAP Supplies

## 2023-08-03 NOTE — Telephone Encounter (Signed)
 [facility] PAP Devices

## 2023-08-05 DIAGNOSIS — J302 Other seasonal allergic rhinitis: Secondary | ICD-10-CM | POA: Diagnosis not present

## 2023-08-05 DIAGNOSIS — R051 Acute cough: Secondary | ICD-10-CM | POA: Diagnosis not present

## 2023-08-05 DIAGNOSIS — R52 Pain, unspecified: Secondary | ICD-10-CM | POA: Diagnosis not present

## 2023-08-05 DIAGNOSIS — J40 Bronchitis, not specified as acute or chronic: Secondary | ICD-10-CM | POA: Diagnosis not present

## 2023-08-05 DIAGNOSIS — R062 Wheezing: Secondary | ICD-10-CM | POA: Diagnosis not present

## 2023-08-05 DIAGNOSIS — R0981 Nasal congestion: Secondary | ICD-10-CM | POA: Diagnosis not present

## 2023-08-08 DIAGNOSIS — K31819 Angiodysplasia of stomach and duodenum without bleeding: Secondary | ICD-10-CM | POA: Diagnosis not present

## 2023-08-08 DIAGNOSIS — D509 Iron deficiency anemia, unspecified: Secondary | ICD-10-CM | POA: Diagnosis not present

## 2023-08-08 DIAGNOSIS — D3A092 Benign carcinoid tumor of the stomach: Secondary | ICD-10-CM | POA: Diagnosis not present

## 2023-08-08 DIAGNOSIS — B9681 Helicobacter pylori [H. pylori] as the cause of diseases classified elsewhere: Secondary | ICD-10-CM | POA: Diagnosis not present

## 2023-08-08 DIAGNOSIS — K2950 Unspecified chronic gastritis without bleeding: Secondary | ICD-10-CM | POA: Diagnosis not present

## 2023-08-08 DIAGNOSIS — K449 Diaphragmatic hernia without obstruction or gangrene: Secondary | ICD-10-CM | POA: Diagnosis not present

## 2023-08-08 DIAGNOSIS — K2960 Other gastritis without bleeding: Secondary | ICD-10-CM | POA: Diagnosis not present

## 2023-08-08 DIAGNOSIS — Q432 Other congenital functional disorders of colon: Secondary | ICD-10-CM | POA: Diagnosis not present

## 2023-08-08 DIAGNOSIS — K2970 Gastritis, unspecified, without bleeding: Secondary | ICD-10-CM | POA: Diagnosis not present

## 2023-08-10 ENCOUNTER — Ambulatory Visit: Payer: Self-pay

## 2023-08-10 DIAGNOSIS — H43393 Other vitreous opacities, bilateral: Secondary | ICD-10-CM | POA: Diagnosis not present

## 2023-08-10 DIAGNOSIS — H5203 Hypermetropia, bilateral: Secondary | ICD-10-CM | POA: Diagnosis not present

## 2023-08-10 DIAGNOSIS — H524 Presbyopia: Secondary | ICD-10-CM | POA: Diagnosis not present

## 2023-08-10 DIAGNOSIS — H52223 Regular astigmatism, bilateral: Secondary | ICD-10-CM | POA: Diagnosis not present

## 2023-08-10 NOTE — Telephone Encounter (Signed)
 Chief Complaint: Continuous Cough  Symptoms: Cough and congestion  Frequency: constant onset a few days before 08/05/23 Pertinent Negatives: Patient denies fever, headache, chest pain  Disposition: [] ED /[] Urgent Care (no appt availability in office) / [] Appointment(In office/virtual)/ []  [facility]/ [x] Home Care/ [] Refused Recommended Disposition /[] [facility]/ []  Follow-up with PCP Additional Notes: Patient states [facility] and was treated for viral bronchitis. Patient was prescribed Cetirizine, brompheniramine-pseudoephedrine- DM syrup, and albuterol inhaler. Patient states her symptoms have improved but the cough is still there. Care advice was given and advised patient to continue treatment from [facility] and if symptoms do not improve in 5-7 days to callback to schedule a follow-up with PCP. Patient verbalized understanding.  Copied from CRM [id number]. Topic: Clinical - Medical Advice >> Aug 10, 2023 11:49 AM Fuller Mandril wrote: Reason for CRM: Patient called. States she was seen at [facility] recently and was  told she had viral bronchitis. Was given cough medicine and inhaler but she still has cough. Cough has not gotten any better. Would like to know what provider suggests. Thank You Reason for Disposition  [1] Recent medical visit within 24 hours AND [2] condition / symptoms BETTER (improving) AND [3] caller has additional questions triager can answer  Answer Assessment - Initial Assessment Questions 1. MAIN CONCERN OR SYMPTOM:  "What is your main concern right now?" "What question do you have?" "What's the main symptom you're worried about?" (e.g., breathing difficulty, cough, fever. pain)     Cough 2. ONSET: "When did the  Cough  start?"     A few days before 08/05/23 3. BETTER-SAME-WORSE: "Are you getting better, staying the same, or getting worse compared to how you felt at your last visit to the doctor (most recent  medical visit)?"     A little better  4. VISIT DATE: "When were you seen?" (Date)     08/05/23 at urgent care  5. VISIT DOCTOR: "What is the name of the doctor taking care of you now?"     [facility]  6. VISIT DIAGNOSIS:  "What was the main symptom or problem that you were seen for?" "Were you given a diagnosis?"      Viral Bronchitis URI  7. VISIT MEDICINES: "Did the doctor order any new medicines for you to use?" If Yes, ask: "Have you filled the prescription and started taking the medicine?"      Cough syrup  8. NEXT APPOINTMENT: "Have you scheduled a follow-up appointment with your doctor?"     No  9. PAIN: "Is there any pain?" If Yes, ask: "How bad is it?"  (Scale 0-10; or mild, moderate, severe)    - NONE (0): no pain    - MILD (1-3): doesn't interfere with normal activities     - MODERATE (4-7): interferes with normal activities or awakens from sleep     - SEVERE (8-10): excruciating pain, unable to do any normal activities     No  10. FEVER: "Do you have a fever?" If Yes, ask: "What is it, how was it measured  and when did it start?"       No  11. OTHER SYMPTOMS: "Do you have any other symptoms?"      A little  Congestion  Protocols used: Recent Medical Visit for Illness Follow-up Call-[facility]

## 2023-08-11 DIAGNOSIS — G4733 Obstructive sleep apnea (adult) (pediatric): Secondary | ICD-10-CM | POA: Diagnosis not present

## 2023-08-15 ENCOUNTER — Other Ambulatory Visit: Payer: Self-pay | Admitting: Family Medicine

## 2023-08-16 ENCOUNTER — Telehealth: Payer: Self-pay | Admitting: Family Medicine

## 2023-08-16 ENCOUNTER — Telehealth: Payer: Self-pay

## 2023-08-16 NOTE — Telephone Encounter (Signed)
 Patient stated that she is taking currently hydrochlorothiazide 12.5 mg daily and losartan 50 mg daily. I called pharmacy [facility] pharmacy and let them know.

## 2023-08-16 NOTE — Telephone Encounter (Signed)
 Copied from CRM [id number]. Topic: Clinical - Prescription Issue >> Aug 16, 2023  3:23 PM Antwanette L wrote: Reason for CRM: Chales Abrahams from [facility] is calling because she needs clarification for hydrochlorothiazide (MICROZIDE) 12.5 MG capsule

## 2023-08-16 NOTE — Progress Notes (Signed)
 Subjective:  Patient ID: Karen Clay, female    DOB: [birth year]  Age: 68 y.o. MRN: [id number]  Chief Complaint  Patient presents with   Medical Management of Chronic Issues   HPI: Hypertension:  Hydrochlorthiazide 12.5 mg daily,  Losartan 50 mg daily.  Hypothyroidism:  Levothyroxine 75 mcg daily.    Depression:  Bupropion XL 300 mg daily.  Fluoxetine 20 mg daily. Doing well with current medication   Hyperlipidemia:  Crestor 5 mg daily.   Eating healthy and exercising.   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   OSA: on cpap. Compliant and benefiting.       05/31/2023    2:29 PM 02/16/2023   10:29 AM 08/04/2022    9:24 AM 02/09/2022   10:28 AM 07/09/2021   10:32 AM  Depression screen PHQ 2/9  Decreased Interest 0 0 0 0 0  Down, Depressed, Hopeless 0 0 0 0 0  PHQ - 2 Score 0 0 0 0 0  Altered sleeping 0 0 0 0   Tired, decreased energy 0 0 0 0   Change in appetite 0 0 0 0   Feeling bad or failure about yourself  0 0 0 0   Trouble concentrating 0 0 0 0   Moving slowly or fidgety/restless 0 0 0 0   Suicidal thoughts 0 0 0 0   PHQ-9 Score 0 0 0 0   Difficult doing work/chores Not difficult at all Not difficult at all Not difficult at all Not difficult at all         05/31/2023    2:27 PM  Fall Risk   Falls in the past year? 0  Number falls in past yr: 0  Injury with Fall? 0  Risk for fall due to : No Fall Risks  Follow up Falls prevention discussed    Patient Care Team: Mercy Stall, MD as PCP - General (Family Medicine) Misenheimer, Emeterio Hansen, MD as Consulting Physician (Unknown Physician Specialty) Steven Elam, [facility] (Inactive) as Pharmacist (Pharmacist) Mariana Shipper, MD as Consulting Physician (Obstetrics and Gynecology) Barbera Books, OD as Consulting Physician (Ophthalmology)   Review of Systems  Constitutional:  Negative for chills, fatigue and fever.  HENT:  Negative for congestion, ear pain, rhinorrhea and sore throat.   Respiratory:  Negative  for cough and shortness of breath.   Cardiovascular:  Negative for chest pain.  Gastrointestinal:  Negative for abdominal pain, constipation, diarrhea, nausea and vomiting.  Genitourinary:  Negative for dysuria and urgency.  Musculoskeletal:  Negative for back pain and myalgias.  Neurological:  Negative for dizziness, weakness, light-headedness and headaches.  Psychiatric/Behavioral:  Negative for dysphoric mood. The patient is not nervous/anxious.     Current Outpatient Medications on File Prior to Visit  Medication Sig Dispense Refill   DUPIXENT 300 MG/2ML SOAJ      buPROPion (WELLBUTRIN XL) 300 MG 24 hr tablet Take 300 mg by mouth daily.      celecoxib (CELEBREX) 200 MG capsule Take 1 capsule (200 mg total) by mouth daily. 30 capsule 3   cetirizine (ZYRTEC) 10 MG tablet Take 1 tablet (10 mg total) by mouth daily. 30 tablet 11   cyanocobalamin (VITAMIN B12) 1000 MCG/ML injection INJECT 1 ML INTRAMUSCULARLY ONCE A MONTH 3 mL 1   FLUoxetine (PROZAC) 20 MG capsule TAKE 1 CAPSULE BY MOUTH TWICE DAILY 180 capsule 2   hydrochlorothiazide (MICROZIDE) 12.5 MG capsule TAKE 1 CAPSULE BY MOUTH DAILY WITH LOSARTAN 90 capsule 0   levothyroxine (  SYNTHROID) 75 MCG tablet TAKE 1 TABLET BY MOUTH ONCE DAILY 90 tablet 0   losartan (COZAAR) 50 MG tablet TAKE 1 TABLET BY MOUTH DAILY WITH HCTZ 90 tablet 1   MISC NATURAL PRODUCTS PO Take 1 tablet by mouth daily. Blood builder supplement from health food store     potassium chloride SA (KLOR-CON M) 20 MEQ tablet TAKE 1 TABLET BY MOUTH DAILY 90 tablet 0   rosuvastatin (CRESTOR) 5 MG tablet TAKE 1 TABLET BY MOUTH DAILY 90 tablet 0   triamcinolone ointment (KENALOG) 0.1 % Apply 1 application topically 2 (two) times daily. 80 g 1   No current facility-administered medications on file prior to visit.   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   Vitamin B deficiency    Past Surgical History:  Procedure Laterality Date   CESAREAN SECTION     REPLACEMENT TOTAL KNEE Right    unicompartmental knee replacement Left     Family History  Problem Relation Age of Onset   Aneurysm Mother    Heart failure Father    CAD Father    Social History   Socioeconomic History   Marital status: Married    Spouse name: Renelda Carry   Number of children: 3   Years of education: Not on file   Highest education level: Not on file  Occupational History   Occupation: Self Employeed, pottery painting class  Tobacco Use   Smoking status: Former    Types: Cigarettes    Passive exposure: Never   Smokeless tobacco: Never  Vaping Use   Vaping status: Never Used  Substance and Sexual Activity   Alcohol use: Not Currently    Comment: occasional glass of wine   Drug use: No   Sexual activity: Not on file  Other Topics Concern   Not on file  Social History Narrative   Not on file   Social Drivers of Health   Financial Resource Strain: Low Risk  (05/31/2023)   Overall Financial Resource Strain (CARDIA)    Difficulty of Paying Living Expenses: Not hard at all  Food Insecurity: No Food Insecurity (05/31/2023)   Hunger Vital Sign    Worried About Running Out of Food in the Last Year: Never true    Ran Out of Food in the Last Year: Never true  Transportation Needs: No Transportation Needs (05/31/2023)   PRAPARE - Administrator, Civil Service (Medical): No    Lack of Transportation (Non-Medical): No  Physical Activity: Sufficiently Active (05/31/2023)   Exercise Vital Sign    Days of Exercise per Week: 5  days    Minutes of Exercise per Session: 50 min  Stress: No Stress Concern Present (05/31/2023)   [employer] of Occupational Health - Occupational Stress Questionnaire    Feeling of Stress : Not at all  Social Connections: Socially Integrated (05/31/2023)   Social Connection and Isolation Panel [NHANES]    Frequency of Communication with Friends and Family: More than  three times a week    Frequency of Social Gatherings with Friends and Family: Twice a week    Attends Religious Services: More than 4 times per year    Active Member of [employer] or Organizations: Yes    Attends Engineer, structural: More than 4 times per year    Marital Status: Married    Objective:  BP 136/72   Pulse 63   Temp 97.8 F (36.6 C)   Ht 5' 3.5" (1.613 m)   Wt 173 lb (78.5 kg)   SpO2 97%   BMI 30.16 kg/m      08/17/2023   10:30 AM 05/31/2023    2:24 PM 04/26/2023    2:41 PM  BP/Weight  Systolic BP 136  161  Diastolic BP 72  72  Wt. (Lbs) 173 179 183.8  BMI 30.16 kg/m2 31.21 kg/m2 32.05 kg/m2    Physical Exam Vitals reviewed.  Constitutional:      Appearance: Normal appearance. She is normal weight.  Neck:     Vascular: No carotid bruit.  Cardiovascular:     Rate and Rhythm: Normal rate and regular rhythm.     Heart sounds: Normal heart sounds.  Pulmonary:     Effort: Pulmonary effort is normal. No respiratory distress.     Breath sounds: Normal breath sounds.  Abdominal:     General: Abdomen is flat. Bowel sounds are normal.     Palpations: Abdomen is soft.     Tenderness: There is no abdominal tenderness.  Neurological:     Mental Status: She is alert and oriented to person, place, and time.  Psychiatric:        Mood and Affect: Mood normal.        Behavior: Behavior normal.     Diabetic Foot Exam - Simple   No data filed      Lab Results  Component Value Date   WBC 9.0 08/17/2023   HGB 14.8 08/17/2023   HCT 43.7 08/17/2023   PLT 285 08/17/2023   GLUCOSE 100  (H) 08/17/2023   CHOL 160 08/17/2023   TRIG 70 08/17/2023   HDL 52 08/17/2023   LDLCALC 94 08/17/2023   ALT 27 08/17/2023   AST 23 08/17/2023   NA 141 08/17/2023   K 5.0 08/17/2023   CL 102 08/17/2023   CREATININE 0.71 08/17/2023   BUN 15 08/17/2023   CO2 23 08/17/2023   TSH 1.510 08/17/2023   HGBA1C 5.5 07/09/2021      Assessment & Plan:   Essential hypertension Assessment & Plan: Stable blood pressure Continue on Hydrochlorthiazide 12.5 mg daily,  Losartan 50 mg daily. -Follow up with Dr. Reinhold Carbine in April for chronic management and potential lab work.  Orders: -     CBC with Differential/Platelet -     Comprehensive metabolic panel with GFR  OSA (obstructive sleep apnea) Assessment & Plan: Needed new CPAP. Ordered already.  Compliant and benefits.    Acquired hypothyroidism Assessment & Plan: Previously well controlled Continue Synthroid at current dose  Recheck TSH and adjust Synthroid as indicated    Orders: -     TSH  Mixed hyperlipidemia Assessment & Plan: Low fat diet. Crestor 5 mg daily.   Orders: -     Lipid panel  Mild recurrent major depression (HCC) Assessment & Plan: Continue Prozac and Wellbutrin.  Well-controlled    Encounter for osteoporosis screening in asymptomatic postmenopausal patient -     DG Bone Density; Future  Encounter for immunization -  Pneumococcal conjugate vaccine 20-valent     No orders of the defined types were placed in this encounter.   Orders Placed This Encounter  Procedures   DG Bone Density   Pneumococcal conjugate vaccine 20-valent   CBC with Differential/Platelet   Comprehensive metabolic panel with GFR   Lipid panel   TSH     Follow-up: Return in about 6 months (around 02/16/2024) for chronic follow up.   I,Marla I Leal-Borjas,acting as a scribe for Mercy Stall, MD.,have documented all relevant documentation on the behalf of Mercy Stall, MD,as directed by  Mercy Stall, MD while in the presence  of Mercy Stall, MD.   An After Visit Summary was printed and given to the patient.  I attest that I have reviewed this visit and agree with the plan scribed by my staff.   Mercy Stall, MD [facility] [phone]

## 2023-08-17 ENCOUNTER — Encounter: Payer: Self-pay | Admitting: Family Medicine

## 2023-08-17 ENCOUNTER — Ambulatory Visit: Payer: PPO | Admitting: Family Medicine

## 2023-08-17 VITALS — BP 136/72 | HR 63 | Temp 97.8°F | Ht 63.5 in | Wt 173.0 lb

## 2023-08-17 DIAGNOSIS — G4733 Obstructive sleep apnea (adult) (pediatric): Secondary | ICD-10-CM | POA: Diagnosis not present

## 2023-08-17 DIAGNOSIS — F330 Major depressive disorder, recurrent, mild: Secondary | ICD-10-CM | POA: Diagnosis not present

## 2023-08-17 DIAGNOSIS — Z780 Asymptomatic menopausal state: Secondary | ICD-10-CM | POA: Diagnosis not present

## 2023-08-17 DIAGNOSIS — I10 Essential (primary) hypertension: Secondary | ICD-10-CM

## 2023-08-17 DIAGNOSIS — Z23 Encounter for immunization: Secondary | ICD-10-CM | POA: Diagnosis not present

## 2023-08-17 DIAGNOSIS — Z13820 Encounter for screening for osteoporosis: Secondary | ICD-10-CM | POA: Diagnosis not present

## 2023-08-17 DIAGNOSIS — E782 Mixed hyperlipidemia: Secondary | ICD-10-CM

## 2023-08-17 DIAGNOSIS — E039 Hypothyroidism, unspecified: Secondary | ICD-10-CM | POA: Diagnosis not present

## 2023-08-18 ENCOUNTER — Encounter: Payer: Self-pay | Admitting: Family Medicine

## 2023-08-18 LAB — COMPREHENSIVE METABOLIC PANEL WITH GFR
ALT: 27 IU/L (ref 0–32)
AST: 23 IU/L (ref 0–40)
Albumin: 4.6 g/dL (ref 3.9–4.9)
Alkaline Phosphatase: 103 IU/L (ref 44–121)
BUN/Creatinine Ratio: 21 (ref 12–28)
BUN: 15 mg/dL (ref 8–27)
Bilirubin Total: 0.4 mg/dL (ref 0.0–1.2)
CO2: 23 mmol/L (ref 20–29)
Calcium: 9.9 mg/dL (ref 8.7–10.3)
Chloride: 102 mmol/L (ref 96–106)
Creatinine, Ser: 0.71 mg/dL (ref 0.57–1.00)
Globulin, Total: 2.8 g/dL (ref 1.5–4.5)
Glucose: 100 mg/dL — ABNORMAL HIGH (ref 70–99)
Potassium: 5 mmol/L (ref 3.5–5.2)
Sodium: 141 mmol/L (ref 134–144)
Total Protein: 7.4 g/dL (ref 6.0–8.5)
eGFR: 91 mL/min/{1.73_m2} (ref >59)

## 2023-08-18 LAB — CBC WITH DIFFERENTIAL/PLATELET
Basophils Absolute: 0.1 10*3/uL (ref 0.0–0.2)
Basos: 1 %
EOS (ABSOLUTE): 0.5 10*3/uL — ABNORMAL HIGH (ref 0.0–0.4)
Eos: 5 %
Hematocrit: 43.7 % (ref 34.0–46.6)
Hemoglobin: 14.8 g/dL (ref 11.1–15.9)
Immature Grans (Abs): 0.1 10*3/uL (ref 0.0–0.1)
Immature Granulocytes: 1 %
Lymphocytes Absolute: 1 10*3/uL (ref 0.7–3.1)
Lymphs: 12 %
MCH: 31.5 pg (ref 26.6–33.0)
MCHC: 33.9 g/dL (ref 31.5–35.7)
MCV: 93 fL (ref 79–97)
Monocytes Absolute: 0.7 10*3/uL (ref 0.1–0.9)
Monocytes: 8 %
Neutrophils Absolute: 6.6 10*3/uL (ref 1.4–7.0)
Neutrophils: 73 %
Platelets: 285 10*3/uL (ref 150–450)
RBC: 4.7 x10E6/uL (ref 3.77–5.28)
RDW: 11.8 % (ref 11.7–15.4)
WBC: 9 10*3/uL (ref 3.4–10.8)

## 2023-08-18 LAB — LIPID PANEL
Chol/HDL Ratio: 3.1 ratio (ref 0.0–4.4)
Cholesterol, Total: 160 mg/dL (ref 100–199)
HDL: 52 mg/dL (ref >39)
LDL Chol Calc (NIH): 94 mg/dL (ref 0–99)
Triglycerides: 70 mg/dL (ref 0–149)
VLDL Cholesterol Cal: 14 mg/dL (ref 5–40)

## 2023-08-18 LAB — TSH: TSH: 1.51 u[IU]/mL (ref 0.450–4.500)

## 2023-08-20 NOTE — Assessment & Plan Note (Addendum)
 Needed new CPAP. Ordered already.  Compliant and benefits.

## 2023-08-20 NOTE — Assessment & Plan Note (Signed)
 Previously well controlled Continue Synthroid at current dose  Recheck TSH and adjust Synthroid as indicated

## 2023-08-20 NOTE — Assessment & Plan Note (Signed)
Continue Prozac and Wellbutrin.  Well-controlled

## 2023-08-20 NOTE — Assessment & Plan Note (Signed)
 Stable blood pressure Continue on Hydrochlorthiazide 12.5 mg daily,  Losartan 50 mg daily. -Follow up with Dr. Reinhold Carbine in April for chronic management and potential lab work.

## 2023-08-20 NOTE — Assessment & Plan Note (Signed)
Low fat diet. Crestor 5 mg daily.

## 2023-08-23 ENCOUNTER — Other Ambulatory Visit: Payer: Self-pay | Admitting: Family Medicine

## 2023-09-10 DIAGNOSIS — G4733 Obstructive sleep apnea (adult) (pediatric): Secondary | ICD-10-CM | POA: Diagnosis not present

## 2023-09-12 ENCOUNTER — Other Ambulatory Visit: Payer: Self-pay | Admitting: Family Medicine

## 2023-09-21 DIAGNOSIS — F411 Generalized anxiety disorder: Secondary | ICD-10-CM | POA: Diagnosis not present

## 2023-09-21 DIAGNOSIS — F331 Major depressive disorder, recurrent, moderate: Secondary | ICD-10-CM | POA: Diagnosis not present

## 2023-10-04 ENCOUNTER — Other Ambulatory Visit: Payer: Self-pay | Admitting: Family Medicine

## 2023-11-14 ENCOUNTER — Other Ambulatory Visit: Payer: Self-pay | Admitting: Family Medicine

## 2023-11-21 DIAGNOSIS — G4733 Obstructive sleep apnea (adult) (pediatric): Secondary | ICD-10-CM | POA: Diagnosis not present

## 2023-11-23 ENCOUNTER — Other Ambulatory Visit: Payer: Self-pay | Admitting: Family Medicine

## 2023-11-29 ENCOUNTER — Other Ambulatory Visit: Payer: Self-pay | Admitting: Family Medicine

## 2023-12-15 ENCOUNTER — Other Ambulatory Visit: Payer: Self-pay | Admitting: Family Medicine

## 2023-12-15 DIAGNOSIS — G4733 Obstructive sleep apnea (adult) (pediatric): Secondary | ICD-10-CM | POA: Diagnosis not present

## 2023-12-26 ENCOUNTER — Telehealth: Payer: Self-pay

## 2023-12-26 NOTE — Telephone Encounter (Signed)
 Requested note was faxed as requested  Copied from CRM #[id number]. Topic: General - Other >> Dec 26, 2023  1:14 PM Edsel HERO wrote: [place] with [facility] called to see if office could send over office notes from 08/17/23 for patient's cpap machine. [phone] opt .

## 2024-01-03 DIAGNOSIS — L2089 Other atopic dermatitis: Secondary | ICD-10-CM | POA: Diagnosis not present

## 2024-01-04 ENCOUNTER — Other Ambulatory Visit: Payer: Self-pay | Admitting: Family Medicine

## 2024-01-05 ENCOUNTER — Encounter (HOSPITAL_BASED_OUTPATIENT_CLINIC_OR_DEPARTMENT_OTHER): Payer: Self-pay

## 2024-01-05 ENCOUNTER — Ambulatory Visit (HOSPITAL_BASED_OUTPATIENT_CLINIC_OR_DEPARTMENT_OTHER)
Admission: EM | Admit: 2024-01-05 | Discharge: 2024-01-05 | Disposition: A | Discharge status: Home / Self Care | Attending: Family Medicine | Admitting: Family Medicine

## 2024-01-05 DIAGNOSIS — J029 Acute pharyngitis, unspecified: Secondary | ICD-10-CM | POA: Diagnosis not present

## 2024-01-05 DIAGNOSIS — R051 Acute cough: Secondary | ICD-10-CM | POA: Diagnosis not present

## 2024-01-05 DIAGNOSIS — J039 Acute tonsillitis, unspecified: Secondary | ICD-10-CM

## 2024-01-05 LAB — POCT RAPID STREP A (OFFICE): Rapid Strep A Screen: NEGATIVE

## 2024-01-05 LAB — POC SOFIA SARS ANTIGEN FIA: SARS Coronavirus 2 Ag: NEGATIVE

## 2024-01-05 MED ORDER — CEFDINIR 300 MG PO CAPS: 300.0000 mg | ORAL_CAPSULE | Freq: Two times a day (BID) | ORAL | 0 refills | Status: AC

## 2024-01-05 NOTE — ED Triage Notes (Signed)
 Pt c/o sore throat that started yesterday. She is also having slight nasal congestion, HA- dull, and right side of neck is tender to touch. Pt denies cough, fever, body aches, or chills. Pt has taken ibuprofen with some relief.

## 2024-01-05 NOTE — ED Provider Notes (Signed)
 [facility]    CSN: [id number] Arrival date & time: 01/05/24  1116      History   Chief Complaint Chief Complaint  Patient presents with   Sore Throat    HPI Karen Clay is a 68 y.o. female.   68 year old female with complaint of sore throat, mild headache, some enlarged lymph node or something tender in her right neck, mild nasal congestion and cough.  Denies fever, body aches, nausea, vomiting, constipation, diarrhea.  Her symptoms started on 01/04/2024.   Sore Throat Pertinent negatives include no chest pain, no abdominal pain and no shortness of breath.    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   Vitamin B deficiency     Patient Active Problem List   Diagnosis Date Noted   OSA (obstructive sleep apnea) 07/07/2023   Encounter for immunization 07/07/2023   Atopic dermatitis 04/26/2023   Acute infection of nasal sinus 02/19/2023   Trigger index finger of right hand 02/19/2023   Spinal osteoarthritis 02/19/2023   Acquired hypothyroidism 08/18/2022   Pruritus 08/18/2022   Pain in left lumbar region of back 02/12/2022   Heart murmur 02/12/2022   Osteoporosis screening 02/12/2022   Mild recurrent major depression (HCC) 02/12/2022   Class 1 obesity due to excess calories with body mass index (BMI) of 30.0 to 30.9 in adult 02/12/2022   B12 deficiency 07/12/2021   Prediabetes 07/09/2021   Piriformis syndrome of both sides 07/09/2021   Mixed hyperlipidemia 07/09/2019   Essential  hypertension 07/09/2019    Past Surgical History:  Procedure Laterality Date   CESAREAN SECTION     REPLACEMENT TOTAL KNEE Right    unicompartmental knee replacement Left     OB History   No obstetric history on file.      Home Medications    Prior to Admission medications   Medication Sig Start Date End Date Taking? Authorizing Provider  cefdinir  (OMNICEF ) 300 MG capsule Take 1 capsule (300 mg total) by mouth 2 (two) times daily for 7 days. 01/05/24 01/12/24 Yes Ival Domino, FNP  buPROPion (WELLBUTRIN XL) 300 MG 24 hr tablet Take 300 mg by mouth daily.  04/30/14   [provider]  celecoxib  (CELEBREX ) 200 MG capsule Take 1 capsule (200 mg total) by mouth daily. 02/16/23   CoxAbigail, MD  cetirizine  (ZYRTEC ) 10 MG tablet Take 1 tablet (10 mg total) by mouth daily. 04/26/23   Teressa Harrie HERO, FNP  cyanocobalamin  (VITAMIN B12) 1000 MCG/ML injection INJECT 1 ML INTRAMUSCULARLY ONCE A MONTH 11/23/23   Sherre Abigail, MD  DUPIXENT 300 MG/2ML SOAJ  07/26/23   [provider]  FLUoxetine (PROZAC) 20 MG capsule TAKE 1 CAPSULE BY MOUTH TWICE DAILY 08/23/23   Cox, Abigail, MD  hydrochlorothiazide (MICROZIDE) 12.5 MG capsule TAKE 1 CAPSULE BY MOUTH DAILY WITH LOSARTAN  11/14/23   Cox, Abigail, MD  levothyroxine (SYNTHROID) 75 MCG tablet TAKE 1 TABLET BY MOUTH ONCE DAILY 12/15/23   Sherre Abigail, MD  losartan  (COZAAR ) 50 MG tablet TAKE 1 TABLET BY MOUTH DAILY WITH HCTZ 11/29/23   Cox, Abigail, MD  MISC NATURAL PRODUCTS PO Take 1 tablet by mouth daily. Blood builder supplement from Education officer, community, Historical, MD  potassium chloride SA (KLOR-CON M) 20 MEQ tablet TAKE 1 TABLET BY MOUTH DAILY 11/14/23   Cox, Kirsten, MD  rosuvastatin (CRESTOR) 5 MG tablet TAKE 1 TABLET BY MOUTH DAILY 01/04/24   Cox, Kirsten, MD  triamcinolone  ointment (KENALOG ) 0.1 % Apply 1 application topically 2 (two) times daily. 04/01/21   CoxAbigail, MD    Family History Family History  Problem Relation  Age of Onset   Aneurysm Mother    Heart failure Father    CAD Father     Social History Social History   Tobacco Use   Smoking status: Former    Types: Cigarettes    Passive exposure: Never   Smokeless tobacco: Never  Vaping Use   Vaping status: Never Used  Substance Use Topics   Alcohol use: Not Currently    Comment: occasional glass of wine   Drug use: No     Allergies   Ace inhibitors, Niaspan [niacin], and Zetia [ezetimibe]   Review of Systems Review of Systems  Constitutional:  Negative for chills and fever.  HENT:  Positive for congestion, postnasal drip, rhinorrhea and sore throat. Negative for ear pain.   Eyes:  Negative for pain and visual disturbance.  Respiratory:  Positive for cough. Negative for shortness of breath.   Cardiovascular:  Negative for chest pain and palpitations.  Gastrointestinal:  Negative for abdominal pain, constipation, diarrhea, nausea and vomiting.  Genitourinary:  Negative for dysuria and hematuria.  Musculoskeletal:  Positive for neck pain (Right side of neck with possible swollen glands). Negative for arthralgias and back pain.  Skin:  Negative for color change and rash.  Neurological:  Negative for seizures and syncope.  All other systems reviewed and are negative.    Physical Exam Triage Vital Signs ED Triage Vitals  Encounter Vitals Group     BP 01/05/24 1227 (!) 152/76     Girls Systolic BP Percentile --      Girls Diastolic BP Percentile --      Boys Systolic BP Percentile --      Boys Diastolic BP Percentile --      Pulse Rate 01/05/24 1227 (!) 56     Resp 01/05/24 1227 20     Temp 01/05/24 1227 (!) 97.4 F (36.3 C)     Temp Source 01/05/24 1227 Oral     SpO2 01/05/24 1227 98 %     Weight --      Height --      Head Circumference --      Peak Flow --      Pain Score 01/05/24 1224 8     Pain Loc --      Pain Education --      Exclude from Growth Chart --    No data found.  Updated Vital Signs BP (!) 152/76  (BP Location: Right Arm)   Pulse (!) 56   Temp (!) 97.4 F (36.3 C) (Oral)   Resp 20   SpO2 98%   Visual Acuity Right Eye Distance:   Left Eye Distance:   Bilateral Distance:    Right Eye Near:   Left Eye Near:    Bilateral Near:     Physical Exam Vitals and nursing note reviewed.  Constitutional:  General: She is not in acute distress.    Appearance: She is well-developed. She is not ill-appearing or toxic-appearing.  HENT:     Head: Normocephalic and atraumatic.     Right Ear: Hearing, tympanic membrane, ear canal and external ear normal.     Left Ear: Hearing, tympanic membrane, ear canal and external ear normal.     Nose: No congestion or rhinorrhea.     Right Sinus: No maxillary sinus tenderness or frontal sinus tenderness.     Left Sinus: No maxillary sinus tenderness or frontal sinus tenderness.     Mouth/Throat:     Lips: Pink.     Mouth: Mucous membranes are moist.     Pharynx: Uvula midline. Posterior oropharyngeal erythema present. No oropharyngeal exudate.     Tonsils: No tonsillar exudate (Right tonsil and uvula are erythematous and enlarged without exudate.  No sign of obstructive enlargement.  No appearance of fluctuance or abscess.) or tonsillar abscesses.  Eyes:     Conjunctiva/sclera: Conjunctivae normal.     Pupils: Pupils are equal, round, and reactive to light.  Cardiovascular:     Rate and Rhythm: Normal rate and regular rhythm.     Heart sounds: S1 normal and S2 normal. No murmur heard. Pulmonary:     Effort: Pulmonary effort is normal. No respiratory distress.     Breath sounds: Normal breath sounds. No decreased breath sounds, wheezing, rhonchi or rales.  Abdominal:     General: Bowel sounds are normal.     Palpations: Abdomen is soft.     Tenderness: There is no abdominal tenderness.  Musculoskeletal:        General: No swelling.     Cervical back: Neck supple.  Lymphadenopathy:     Head:     Right side of head: Tonsillar adenopathy  present. No submental, submandibular, preauricular or posterior auricular adenopathy.     Left side of head: Tonsillar adenopathy present. No submental, submandibular, preauricular or posterior auricular adenopathy.     Cervical: Cervical adenopathy present.     Right cervical: Superficial cervical adenopathy present.     Left cervical: Superficial cervical adenopathy present.  Skin:    General: Skin is warm and dry.     Capillary Refill: Capillary refill takes less than 2 seconds.     Findings: No rash.  Neurological:     Mental Status: She is alert and oriented to person, place, and time.  Psychiatric:        Mood and Affect: Mood normal.      UC Treatments / Results  Labs (all labs ordered are listed, but only abnormal results are displayed) Labs Reviewed  POCT RAPID STREP A (OFFICE) - Normal  POC SOFIA SARS ANTIGEN FIA - Normal    EKG   Radiology No results found.  Procedures Procedures (including critical care time)  Medications Ordered in UC Medications - No data to display  Initial Impression / Assessment and Plan / UC Course  I have reviewed the triage vital signs and the nursing notes.  Pertinent labs & imaging results that were available during my care of the patient were reviewed by me and considered in my medical decision making (see chart for details).  Plan of Care: Tonsillitis with sore throat and cough: Rapid strep and rapid COVID are negative.  Based on exam we will treat for tonsillitis.  Throat culture not done.  Cefdinir , 300 mg, 1 pill twice daily for 7 days.  Get plenty of fluids and rest.  No sign of tonsil abscess noted.  Follow-up if symptoms do not improve, worsen or new symptoms occur.  I reviewed the plan of care with the patient and/or the patient's guardian.  The patient and/or guardian had time to ask questions and acknowledged that the questions were answered.  I provided instruction on symptoms or reasons to return here or to go to an  ER, if symptoms/condition did not improve, worsened or if new symptoms occurred.  Final Clinical Impressions(s) / UC Diagnoses   Final diagnoses:  Sore throat  Acute cough  Acute tonsillitis, unspecified etiology     Discharge Instructions      Tonsillitis with sore throat and cough: Rapid strep is negative.  Throat culture is negative.  Right tonsil is significantly enlarged as is the uvula with erythema but no exudate.  This does not appear to be a tonsillar abscess and there is no throat closure noted.  Will treat with cefdinir , 300 mg, 1 pill twice daily for 7 days.  Since I am treating her with antibiotics I am going to defer the throat culture.  Get plenty of fluids and rest.  Encouraged use of probiotics or eating yogurt to prevent antibiotic related diarrhea.  Follow-up if symptoms do not improve, worsen or new symptoms occur.     ED Prescriptions     Medication Sig Dispense Auth. Provider   cefdinir  (OMNICEF ) 300 MG capsule Take 1 capsule (300 mg total) by mouth 2 (two) times daily for 7 days. 14 capsule Ival Domino, FNP      PDMP not reviewed this encounter.   Ival Domino, FNP 01/05/24 [phone]

## 2024-01-05 NOTE — Discharge Instructions (Addendum)
 Tonsillitis with sore throat and cough: Rapid strep is negative.  Throat culture is negative.  Right tonsil is significantly enlarged as is the uvula with erythema but no exudate.  This does not appear to be a tonsillar abscess and there is no throat closure noted.  Will treat with cefdinir , 300 mg, 1 pill twice daily for 7 days.  Since I am treating her with antibiotics I am going to defer the throat culture.  Get plenty of fluids and rest.  Encouraged use of probiotics or eating yogurt to prevent antibiotic related diarrhea.  Follow-up if symptoms do not improve, worsen or new symptoms occur.

## 2024-01-15 DIAGNOSIS — G4733 Obstructive sleep apnea (adult) (pediatric): Secondary | ICD-10-CM | POA: Diagnosis not present

## 2024-01-25 DIAGNOSIS — F411 Generalized anxiety disorder: Secondary | ICD-10-CM | POA: Diagnosis not present

## 2024-01-25 DIAGNOSIS — F331 Major depressive disorder, recurrent, moderate: Secondary | ICD-10-CM | POA: Diagnosis not present

## 2024-02-09 DIAGNOSIS — Z1231 Encounter for screening mammogram for malignant neoplasm of breast: Secondary | ICD-10-CM | POA: Diagnosis not present

## 2024-02-09 DIAGNOSIS — Z1331 Encounter for screening for depression: Secondary | ICD-10-CM | POA: Diagnosis not present

## 2024-02-09 DIAGNOSIS — Z01419 Encounter for gynecological examination (general) (routine) without abnormal findings: Secondary | ICD-10-CM | POA: Diagnosis not present

## 2024-02-10 ENCOUNTER — Other Ambulatory Visit: Payer: Self-pay | Admitting: Family Medicine

## 2024-02-14 DIAGNOSIS — G4733 Obstructive sleep apnea (adult) (pediatric): Secondary | ICD-10-CM | POA: Diagnosis not present

## 2024-02-15 NOTE — Assessment & Plan Note (Addendum)
 A1c levels indicate stable prediabetes status. - Order urine test to check for proteinuria.  Orders:   POCT glycosylated hemoglobin (Hb A1C)   Microalbumin / creatinine urine ratio

## 2024-02-15 NOTE — Assessment & Plan Note (Addendum)
 Awaiting cholesterol levels from recent blood work.  Orders:   POCT Lipid Panel   TSH

## 2024-02-15 NOTE — Progress Notes (Signed)
 Subjective:  Patient ID: Karen Clay, female    DOB: [birth year]  Age: 68 y.o. MRN: [id number]  Chief Complaint  Patient presents with   Medical Management of Chronic Issues    Bone density scheduled, recently had mammogram will have results sent to our office. Needs lab results faxed to [facility]- Dr. Jolene Jewel.    HPI: Discussed the use of AI scribe software for clinical note transcription with the patient, who gave verbal consent to proceed.  History of Present Illness Karen Clay is a 68 year old female who presents for routine follow-up and blood work.  Hypertension - Treated with losartan  50 mg once daily and hydrochlorothiazide 12.5 mg once daily. - No current symptoms of chest pain or shortness of breath.  Eczematous dermatitis - Managed with dupilumab. - No current symptoms of rash or pruritus.  Thyroid  dysfunction - Treated with levothyroxine 75 mcg once daily.  Mood disorder - Managed with fluoxetine 20 mg twice daily and bupropion XL 300 mg once daily.  Allergic rhinitis - Managed with cetirizine  as needed.  Joint pain - Managed with celecoxib  200 mg once daily.  Nutritional supplementation - Takes daily potassium supplement. - Receives monthly B12 injections. - Consumes yogurt and cheese regularly instead of calcium with vitamin D supplements.  Prediabetes - Consistently good A1c levels. - No diagnosis of diabetes.  Recent infectious symptoms - In August, experienced tonsillitis with severe sore throat, initially concerned for COVID-19, but COVID-19 was ruled out. - Recovered without further issues.  Preventive health maintenance - Recent mammogram completed. - Bone density test scheduled in two weeks.  General health status - No current symptoms of fever, chills, sweats, sore throat, stuffy nose, chest pain, or shortness of breath. - Maintains a healthy diet and exercises regularly.       02/16/2024    10:30 AM 05/31/2023    2:29 PM 02/16/2023   10:29 AM 08/04/2022    9:24 AM 02/09/2022   10:28 AM  Depression screen PHQ 2/9  Decreased Interest 0 0 0 0 0  Down, Depressed, Hopeless 0 0 0 0 0  PHQ - 2 Score 0 0 0 0 0  Altered sleeping 0 0 0 0 0  Tired, decreased energy 0 0 0 0 0  Change in appetite 0 0 0 0 0  Feeling bad or failure about yourself  0 0 0 0 0  Trouble concentrating 0 0 0 0 0  Moving slowly or fidgety/restless 0 0 0 0 0  Suicidal thoughts 0 0 0 0 0  PHQ-9 Score 0 0 0 0 0  Difficult doing work/chores Not difficult at all Not difficult at all Not difficult at all Not difficult at all Not difficult at all        05/31/2023    2:27 PM  Fall Risk   Falls in the past year? 0  Number falls in past yr: 0  Injury with Fall? 0  Risk for fall due to : No Fall Risks  Follow up Falls prevention discussed    Patient Care Team: Sherre Clapper, MD as PCP - General (Family Medicine) Misenheimer, Evalene, MD as Consulting Physician (Unknown Physician Specialty) Kennyth Philips, MD as Consulting Physician (Obstetrics and Gynecology) Vannie Elsie HERO, OD as Consulting Physician (Ophthalmology) Jewell, Jolene R, MD as Consulting Physician (Dermatology)   Review of Systems  Constitutional:  Negative for chills, fatigue and fever.  HENT:  Negative for congestion, ear pain and sore throat.  Respiratory:  Negative for cough and shortness of breath.   Cardiovascular:  Negative for chest pain.  Gastrointestinal:  Negative for abdominal pain, constipation, diarrhea, nausea and vomiting.  Genitourinary:  Negative for dysuria and urgency.  Musculoskeletal:  Negative for arthralgias and myalgias.  Skin:  Negative for rash.  Neurological:  Negative for dizziness and headaches.  Psychiatric/Behavioral:  Negative for dysphoric mood. The patient is not nervous/anxious.     Current Outpatient Medications on File Prior to Visit  Medication Sig Dispense Refill   buPROPion (WELLBUTRIN XL) 300  MG 24 hr tablet Take 300 mg by mouth daily.      celecoxib  (CELEBREX ) 200 MG capsule Take 1 capsule (200 mg total) by mouth daily. 30 capsule 3   cetirizine  (ZYRTEC ) 10 MG tablet Take 1 tablet (10 mg total) by mouth daily. 30 tablet 11   cyanocobalamin  (VITAMIN B12) 1000 MCG/ML injection INJECT 1 ML INTRAMUSCULARLY ONCE A MONTH 3 mL 0   DUPIXENT 300 MG/2ML SOAJ      FLUoxetine (PROZAC) 20 MG capsule TAKE 1 CAPSULE BY MOUTH TWICE DAILY 180 capsule 1   hydrochlorothiazide (MICROZIDE) 12.5 MG capsule TAKE 1 CAPSULE BY MOUTH DAILY WITH LOSARTAN  90 capsule 0   levothyroxine (SYNTHROID) 75 MCG tablet TAKE 1 TABLET BY MOUTH ONCE DAILY 90 tablet 0   losartan  (COZAAR ) 50 MG tablet TAKE 1 TABLET BY MOUTH DAILY WITH HCTZ 90 tablet 0   MISC NATURAL PRODUCTS PO Take 1 tablet by mouth daily. Blood builder supplement from health food store     potassium chloride SA (KLOR-CON M) 20 MEQ tablet TAKE 1 TABLET BY MOUTH DAILY 90 tablet 0   rosuvastatin (CRESTOR) 5 MG tablet TAKE 1 TABLET BY MOUTH DAILY 90 tablet 0   triamcinolone  ointment (KENALOG ) 0.1 % Apply 1 application topically 2 (two) times daily. 80 g 1   No current facility-administered medications on file prior to visit.   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   Vitamin B deficiency    Past Surgical History:  Procedure Laterality Date   CESAREAN SECTION     REPLACEMENT TOTAL KNEE Right    unicompartmental knee replacement Left     Family History  Problem Relation Age of Onset    Aneurysm Mother    Heart failure Father    CAD Father    Social History   Socioeconomic History   Marital status: Married    Spouse name: Karen Clay   Number of children: 3   Years of education: Not on file   Highest education level: Not on file  Occupational History   Occupation: Self Employeed, pottery painting class  Tobacco Use   Smoking status: Former    Types: Cigarettes    Passive exposure: Never   Smokeless tobacco: Never  Vaping Use   Vaping status: Never Used  Substance and Sexual Activity   Alcohol use: Not Currently    Comment: occasional glass of wine   Drug use: No  Sexual activity: Not on file  Other Topics Concern   Not on file  Social History Narrative   Not on file   Social Drivers of Health   Financial Resource Strain: Low Risk  (05/31/2023)   Overall Financial Resource Strain (CARDIA)    Difficulty of Paying Living Expenses: Not hard at all  Food Insecurity: No Food Insecurity (05/31/2023)   Hunger Vital Sign    Worried About Running Out of Food in the Last Year: Never true    Ran Out of Food in the Last Year: Never true  Transportation Needs: No Transportation Needs (05/31/2023)   PRAPARE - Administrator, Civil Service (Medical): No    Lack of Transportation (Non-Medical): No  Physical Activity: Sufficiently Active (05/31/2023)   Exercise Vital Sign    Days of Exercise per Week: 5 days    Minutes of Exercise per Session: 50 min  Stress: No Stress Concern Present (05/31/2023)   [employer] of Occupational Health - Occupational Stress Questionnaire    Feeling of Stress : Not at all  Social Connections: Socially Integrated (05/31/2023)   Social Connection and Isolation Panel    Frequency of Communication with Friends and Family: More than three times a week    Frequency of Social Gatherings with Friends and Family: Twice a week    Attends Religious Services: More than 4 times per year    Active Member of [employer] or Organizations: Yes     Attends Engineer, structural: More than 4 times per year    Marital Status: Married    Objective:  BP 122/72   Pulse 71   Temp 98.2 F (36.8 C)   Ht 5' 3.5 (1.613 m)   Wt 164 lb (74.4 kg)   SpO2 98%   BMI 28.60 kg/m      02/16/2024   10:13 AM 01/05/2024   12:27 PM 08/17/2023   10:30 AM  BP/Weight  Systolic BP 122 152 136  Diastolic BP 72 76 72  Wt. (Lbs) 164  173  BMI 28.6 kg/m2  30.16 kg/m2    Physical Exam Vitals reviewed.  Constitutional:      Appearance: Normal appearance. She is normal weight.  Neck:     Vascular: No carotid bruit.  Cardiovascular:     Rate and Rhythm: Normal rate and regular rhythm.     Heart sounds: Normal heart sounds.  Pulmonary:     Effort: Pulmonary effort is normal. No respiratory distress.     Breath sounds: Normal breath sounds.  Abdominal:     General: Abdomen is flat. Bowel sounds are normal.     Palpations: Abdomen is soft.     Tenderness: There is no abdominal tenderness.  Neurological:     Mental Status: She is alert and oriented to person, place, and time.  Psychiatric:        Mood and Affect: Mood normal.        Behavior: Behavior normal.      Diabetic foot exam was performed with the following findings:   No deformities, ulcerations, or other skin breakdown Normal sensation of 10g monofilament Intact posterior tibialis and dorsalis pedis pulses      Lab Results  Component Value Date   WBC 5.7 02/16/2024   HGB 13.3 02/16/2024   HCT 41.2 02/16/2024   PLT 267 02/16/2024   GLUCOSE 90 02/16/2024   CHOL 160 08/17/2023   TRIG 70 08/17/2023   HDL 52 08/17/2023   LDLCALC 94 08/17/2023  ALT 17 02/16/2024   AST 22 02/16/2024   NA 140 02/16/2024   K 4.8 02/16/2024   CL 103 02/16/2024   CREATININE 0.73 02/16/2024   BUN 13 02/16/2024   CO2 22 02/16/2024   TSH 1.800 02/16/2024   HGBA1C 5.1 02/16/2024    Results for orders placed or performed in visit on 02/16/24  POCT Lipid Panel   Collection Time:  02/16/24 10:31 AM  Result Value Ref Range   TC 167    HDL 63    TRG 65    LDL 92    Non-HDL 105    TC/HDL    POCT glycosylated hemoglobin (Hb A1C)   Collection Time: 02/16/24 10:31 AM  Result Value Ref Range   Hemoglobin A1C     HbA1c POC (<> result, manual entry) 5.1 4.0 - 5.6 %   HbA1c, POC (prediabetic range)     HbA1c, POC (controlled diabetic range)    CBC with Differential/Platelet   Collection Time: 02/16/24 10:37 AM  Result Value Ref Range   WBC 5.7 3.4 - 10.8 x10E3/uL   RBC 4.43 3.77 - 5.28 x10E6/uL   Hemoglobin 13.3 11.1 - 15.9 g/dL   Hematocrit 58.7 65.9 - 46.6 %   MCV 93 79 - 97 fL   MCH 30.0 26.6 - 33.0 pg   MCHC 32.3 31.5 - 35.7 g/dL   RDW 86.9 88.2 - 84.5 %   Platelets 267 150 - 450 x10E3/uL   Neutrophils 65 Not Estab. %   Lymphs 16 Not Estab. %   Monocytes 9 Not Estab. %   Eos 7 Not Estab. %   Basos 2 Not Estab. %   Neutrophils Absolute 3.8 1.4 - 7.0 x10E3/uL   Lymphocytes Absolute 0.9 0.7 - 3.1 x10E3/uL   Monocytes Absolute 0.5 0.1 - 0.9 x10E3/uL   EOS (ABSOLUTE) 0.4 0.0 - 0.4 x10E3/uL   Basophils Absolute 0.1 0.0 - 0.2 x10E3/uL   Immature Granulocytes 0 Not Estab. %   Immature Grans (Abs) 0.0 0.0 - 0.1 x10E3/uL  Comprehensive metabolic panel with GFR   Collection Time: 02/16/24 10:37 AM  Result Value Ref Range   Glucose 90 70 - 99 mg/dL   BUN 13 8 - 27 mg/dL   Creatinine, Ser 9.26 0.57 - 1.00 mg/dL   eGFR 88 >40 fO/fpw/8.26   BUN/Creatinine Ratio 18 12 - 28   Sodium 140 134 - 144 mmol/L   Potassium 4.8 3.5 - 5.2 mmol/L   Chloride 103 96 - 106 mmol/L   CO2 22 20 - 29 mmol/L   Calcium 9.6 8.7 - 10.3 mg/dL   Total Protein 7.4 6.0 - 8.5 g/dL   Albumin 4.6 3.9 - 4.9 g/dL   Globulin, Total 2.8 1.5 - 4.5 g/dL   Bilirubin Total 0.6 0.0 - 1.2 mg/dL   Alkaline Phosphatase 85 49 - 135 IU/L   AST 22 0 - 40 IU/L   ALT 17 0 - 32 IU/L  TSH   Collection Time: 02/16/24 10:37 AM  Result Value Ref Range   TSH 1.800 0.450 - 4.500 uIU/mL  HCV Ab w Reflex to  Quant PCR   Collection Time: 02/16/24 10:41 AM  Result Value Ref Range   HCV Ab Non Reactive Non Reactive  Microalbumin / creatinine urine ratio   Collection Time: 02/16/24 10:41 AM  Result Value Ref Range   Creatinine, Urine 43.7 Not Estab. mg/dL   Microalbumin, Urine 89.8 Not Estab. ug/mL   Microalb/Creat Ratio 23 0 - 29 mg/g creat  Interpretation:   Collection Time: 02/16/24 10:41 AM  Result Value Ref Range   HCV Interp 1: Comment   .  Assessment & Plan:   Assessment & Plan Prediabetes A1c levels indicate stable prediabetes status. - Order urine test to check for proteinuria.  Orders:   POCT glycosylated hemoglobin (Hb A1C)   Microalbumin / creatinine urine ratio  Essential hypertension Blood pressure well-controlled on current medication. - Continue losartan  50 mg once daily and hydrochlorothiazide 12.5 mg once daily.  Orders:   CBC with Differential/Platelet   Comprehensive metabolic panel with GFR  B12 deficiency Receiving ongoing monthly B12 injections. - Continue monthly B12 injections.    Mixed hyperlipidemia Awaiting cholesterol levels from recent blood work.  Orders:   POCT Lipid Panel   TSH  Acquired hypothyroidism On current dose of levothyroxine. - Continue levothyroxine 75 mcg once daily.    Encounter for immunization  Orders:   Flu vaccine HIGH DOSE PF(Fluzone Trivalent)  Need for hepatitis C screening test  Orders:   HCV Ab w Reflex to Quant PCR   General Health Maintenance Up to date with flu vaccination. Mammogram done last week, bone density test scheduled in two weeks. Due for tetanus vaccine and hepatitis C screening. - Request mammogram and bone density results to be sent to the office. - Advise to obtain tetanus vaccine at pharmacy. - Order hepatitis C screening.  Body mass index is 28.6 kg/m.    No orders of the defined types were placed in this encounter.   Orders Placed This Encounter  Procedures   Flu vaccine  HIGH DOSE PF(Fluzone Trivalent)   CBC with Differential/Platelet   Comprehensive metabolic panel with GFR   TSH   HCV Ab w Reflex to Quant PCR   Microalbumin / creatinine urine ratio   Interpretation:   POCT Lipid Panel   POCT glycosylated hemoglobin (Hb A1C)     Follow-up: Return in about 6 months (around 08/16/2024) for chronic follow up.  An After Visit Summary was printed and given to the patient.  Abigail Free, MD [facility] [phone]

## 2024-02-15 NOTE — Assessment & Plan Note (Addendum)
 On current dose of levothyroxine. - Continue levothyroxine 75 mcg once daily.

## 2024-02-15 NOTE — Assessment & Plan Note (Addendum)
 Receiving ongoing monthly B12 injections. - Continue monthly B12 injections.

## 2024-02-15 NOTE — Assessment & Plan Note (Addendum)
 Blood pressure well-controlled on current medication. - Continue losartan  50 mg once daily and hydrochlorothiazide 12.5 mg once daily.  Orders:   CBC with Differential/Platelet   Comprehensive metabolic panel with GFR

## 2024-02-16 ENCOUNTER — Encounter: Payer: Self-pay | Admitting: Family Medicine

## 2024-02-16 ENCOUNTER — Ambulatory Visit: Admitting: Family Medicine

## 2024-02-16 VITALS — BP 122/72 | HR 71 | Temp 98.2°F | Ht 63.5 in | Wt 164.0 lb

## 2024-02-16 DIAGNOSIS — I10 Essential (primary) hypertension: Secondary | ICD-10-CM | POA: Diagnosis not present

## 2024-02-16 DIAGNOSIS — R7303 Prediabetes: Secondary | ICD-10-CM | POA: Diagnosis not present

## 2024-02-16 DIAGNOSIS — Z1159 Encounter for screening for other viral diseases: Secondary | ICD-10-CM

## 2024-02-16 DIAGNOSIS — E782 Mixed hyperlipidemia: Secondary | ICD-10-CM | POA: Diagnosis not present

## 2024-02-16 DIAGNOSIS — Z23 Encounter for immunization: Secondary | ICD-10-CM

## 2024-02-16 DIAGNOSIS — E538 Deficiency of other specified B group vitamins: Secondary | ICD-10-CM

## 2024-02-16 DIAGNOSIS — E039 Hypothyroidism, unspecified: Secondary | ICD-10-CM

## 2024-02-16 LAB — POCT LIPID PANEL
HDL: 63
LDL: 92
Non-HDL: 105
TC: 167
TRG: 65

## 2024-02-16 LAB — POCT GLYCOSYLATED HEMOGLOBIN (HGB A1C): HbA1c POC (<> result, manual entry): 5.1 % (ref 4.0–5.6)

## 2024-02-16 NOTE — Patient Instructions (Addendum)
                         Contains text generated by Abridge.                                 Contains text generated by Abridge.

## 2024-02-17 ENCOUNTER — Ambulatory Visit: Payer: Self-pay | Admitting: Family Medicine

## 2024-02-17 LAB — COMPREHENSIVE METABOLIC PANEL WITH GFR
ALT: 17 IU/L (ref 0–32)
AST: 22 IU/L (ref 0–40)
Albumin: 4.6 g/dL (ref 3.9–4.9)
Alkaline Phosphatase: 85 IU/L (ref 49–135)
BUN/Creatinine Ratio: 18 (ref 12–28)
BUN: 13 mg/dL (ref 8–27)
Bilirubin Total: 0.6 mg/dL (ref 0.0–1.2)
CO2: 22 mmol/L (ref 20–29)
Calcium: 9.6 mg/dL (ref 8.7–10.3)
Chloride: 103 mmol/L (ref 96–106)
Creatinine, Ser: 0.73 mg/dL (ref 0.57–1.00)
Globulin, Total: 2.8 g/dL (ref 1.5–4.5)
Glucose: 90 mg/dL (ref 70–99)
Potassium: 4.8 mmol/L (ref 3.5–5.2)
Sodium: 140 mmol/L (ref 134–144)
Total Protein: 7.4 g/dL (ref 6.0–8.5)
eGFR: 88 mL/min/1.73 (ref >59)

## 2024-02-17 LAB — CBC WITH DIFFERENTIAL/PLATELET
Basophils Absolute: 0.1 x10E3/uL (ref 0.0–0.2)
Basos: 2 %
EOS (ABSOLUTE): 0.4 x10E3/uL (ref 0.0–0.4)
Eos: 7 %
Hematocrit: 41.2 % (ref 34.0–46.6)
Hemoglobin: 13.3 g/dL (ref 11.1–15.9)
Immature Grans (Abs): 0 x10E3/uL (ref 0.0–0.1)
Immature Granulocytes: 0 %
Lymphocytes Absolute: 0.9 x10E3/uL (ref 0.7–3.1)
Lymphs: 16 %
MCH: 30 pg (ref 26.6–33.0)
MCHC: 32.3 g/dL (ref 31.5–35.7)
MCV: 93 fL (ref 79–97)
Monocytes Absolute: 0.5 x10E3/uL (ref 0.1–0.9)
Monocytes: 9 %
Neutrophils Absolute: 3.8 x10E3/uL (ref 1.4–7.0)
Neutrophils: 65 %
Platelets: 267 x10E3/uL (ref 150–450)
RBC: 4.43 x10E6/uL (ref 3.77–5.28)
RDW: 13 % (ref 11.7–15.4)
WBC: 5.7 x10E3/uL (ref 3.4–10.8)

## 2024-02-17 LAB — MICROALBUMIN / CREATININE URINE RATIO
Creatinine, Urine: 43.7 mg/dL
Microalb/Creat Ratio: 23 mg/g{creat} (ref 0–29)
Microalbumin, Urine: 10.1 ug/mL

## 2024-02-17 LAB — HCV AB W REFLEX TO QUANT PCR: HCV Ab: NONREACTIVE

## 2024-02-17 LAB — TSH: TSH: 1.8 u[IU]/mL (ref 0.450–4.500)

## 2024-02-17 LAB — HCV INTERPRETATION

## 2024-02-29 ENCOUNTER — Other Ambulatory Visit: Payer: Self-pay | Admitting: Family Medicine

## 2024-03-06 DIAGNOSIS — Z780 Asymptomatic menopausal state: Secondary | ICD-10-CM | POA: Diagnosis not present

## 2024-03-06 DIAGNOSIS — M85852 Other specified disorders of bone density and structure, left thigh: Secondary | ICD-10-CM | POA: Diagnosis not present

## 2024-03-12 ENCOUNTER — Other Ambulatory Visit: Payer: Self-pay | Admitting: Family Medicine

## 2024-03-21 ENCOUNTER — Other Ambulatory Visit: Payer: Self-pay | Admitting: Family Medicine

## 2024-03-31 ENCOUNTER — Other Ambulatory Visit: Payer: Self-pay | Admitting: Family Medicine

## 2024-04-15 DIAGNOSIS — G4733 Obstructive sleep apnea (adult) (pediatric): Secondary | ICD-10-CM | POA: Diagnosis not present

## 2024-05-15 ENCOUNTER — Other Ambulatory Visit: Payer: Self-pay | Admitting: Family Medicine

## 2024-05-23 ENCOUNTER — Other Ambulatory Visit: Payer: Self-pay | Admitting: Family Medicine

## 2024-06-01 ENCOUNTER — Other Ambulatory Visit: Payer: Self-pay | Admitting: Family Medicine

## 2024-08-16 ENCOUNTER — Ambulatory Visit: Admitting: Family Medicine
